# Patient Record
Sex: Female | Born: 1975 | Race: Black or African American | Hispanic: No | Marital: Single | Smoking: Current some day smoker
Health system: Southern US, Community
[De-identification: ages and names within clinical notes are randomized; demographics above are authoritative.]

## PROBLEM LIST (undated history)

## (undated) DIAGNOSIS — J45909 Unspecified asthma, uncomplicated: Secondary | ICD-10-CM

## (undated) DIAGNOSIS — E119 Type 2 diabetes mellitus without complications: Secondary | ICD-10-CM

## (undated) DIAGNOSIS — M1990 Unspecified osteoarthritis, unspecified site: Secondary | ICD-10-CM

## (undated) DIAGNOSIS — I10 Essential (primary) hypertension: Secondary | ICD-10-CM

## (undated) HISTORY — DX: Essential (primary) hypertension: I10

---

## 2000-03-13 HISTORY — PX: CHOLECYSTECTOMY: SHX55

## 2017-03-11 ENCOUNTER — Ambulatory Visit (HOSPITAL_COMMUNITY): Payer: Self-pay | Attending: Emergency Medicine

## 2017-03-11 ENCOUNTER — Encounter (HOSPITAL_COMMUNITY): Payer: Self-pay | Admitting: *Deleted

## 2017-03-11 ENCOUNTER — Emergency Department (HOSPITAL_COMMUNITY)
Admission: EM | Admit: 2017-03-11 | Discharge: 2017-03-11 | Disposition: A | Discharge status: Home / Self Care | Payer: Self-pay | Attending: Emergency Medicine | Admitting: Emergency Medicine

## 2017-03-11 DIAGNOSIS — R52 Pain, unspecified: Secondary | ICD-10-CM | POA: Insufficient documentation

## 2017-03-11 DIAGNOSIS — Z3A09 9 weeks gestation of pregnancy: Secondary | ICD-10-CM

## 2017-03-11 DIAGNOSIS — Z202 Contact with and (suspected) exposure to infections with a predominantly sexual mode of transmission: Secondary | ICD-10-CM | POA: Insufficient documentation

## 2017-03-11 DIAGNOSIS — Z3A08 8 weeks gestation of pregnancy: Secondary | ICD-10-CM | POA: Insufficient documentation

## 2017-03-11 DIAGNOSIS — O26891 Other specified pregnancy related conditions, first trimester: Secondary | ICD-10-CM | POA: Insufficient documentation

## 2017-03-11 DIAGNOSIS — O4691 Antepartum hemorrhage, unspecified, first trimester: Secondary | ICD-10-CM | POA: Insufficient documentation

## 2017-03-11 DIAGNOSIS — N898 Other specified noninflammatory disorders of vagina: Secondary | ICD-10-CM | POA: Insufficient documentation

## 2017-03-11 DIAGNOSIS — O98211 Gonorrhea complicating pregnancy, first trimester: Secondary | ICD-10-CM | POA: Insufficient documentation

## 2017-03-11 LAB — COMPREHENSIVE METABOLIC PANEL
ALK PHOS: 87 U/L (ref 38–126)
ALT: 18 U/L (ref 14–54)
AST: 19 U/L (ref 15–41)
Albumin: 3.7 g/dL (ref 3.5–5.0)
Anion gap: 11 (ref 5–15)
BUN: 10 mg/dL (ref 6–20)
CALCIUM: 9.3 mg/dL (ref 8.9–10.3)
CO2: 19 mmol/L — ABNORMAL LOW (ref 22–32)
CREATININE: 0.73 mg/dL (ref 0.44–1.00)
Chloride: 104 mmol/L (ref 101–111)
GFR calc non Af Amer: >60 mL/min (ref >60)
Glucose, Bld: 95 mg/dL (ref 65–99)
Potassium: 4 mmol/L (ref 3.5–5.1)
Sodium: 134 mmol/L — ABNORMAL LOW (ref 135–145)
Total Bilirubin: 0.2 mg/dL — ABNORMAL LOW (ref 0.3–1.2)
Total Protein: 7.7 g/dL (ref 6.5–8.1)

## 2017-03-11 LAB — URINALYSIS, ROUTINE W REFLEX MICROSCOPIC
GLUCOSE, UA: NEGATIVE mg/dL
Hgb urine dipstick: NEGATIVE
KETONES UR: 5 mg/dL — AB
Nitrite: NEGATIVE
PH: 5 (ref 5.0–8.0)
Protein, ur: 30 mg/dL — AB
SPECIFIC GRAVITY, URINE: 1.033 — AB (ref 1.005–1.030)

## 2017-03-11 LAB — WET PREP, GENITAL
Clue Cells Wet Prep HPF POC: NONE SEEN
SPERM: NONE SEEN
TRICH WET PREP: NONE SEEN
Yeast Wet Prep HPF POC: NONE SEEN

## 2017-03-11 LAB — CBC
HCT: 41.4 % (ref 36.0–46.0)
Hemoglobin: 14.8 g/dL (ref 12.0–15.0)
MCH: 30 pg (ref 26.0–34.0)
MCHC: 35.7 g/dL (ref 30.0–36.0)
MCV: 84 fL (ref 78.0–100.0)
Platelets: 210 10*3/uL (ref 150–400)
RBC: 4.93 MIL/uL (ref 3.87–5.11)
RDW: 13.5 % (ref 11.5–15.5)
WBC: 19.9 10*3/uL — AB (ref 4.0–10.5)

## 2017-03-11 LAB — LIPASE, BLOOD: Lipase: 17 U/L (ref 11–51)

## 2017-03-11 LAB — I-STAT BETA HCG BLOOD, ED (MC, WL, AP ONLY): I-stat hCG, quantitative: >2000 m[IU]/mL — ABNORMAL HIGH (ref <5)

## 2017-03-11 LAB — HCG, QUANTITATIVE, PREGNANCY: HCG, BETA CHAIN, QUANT, S: 162488 m[IU]/mL — AB (ref <5)

## 2017-03-11 MED ORDER — STERILE WATER FOR INJECTION IJ SOLN
INTRAMUSCULAR | Status: AC
  Filled 2017-03-11: qty 10

## 2017-03-11 MED ORDER — CEFTRIAXONE SODIUM 250 MG IJ SOLR
250.0000 mg | Freq: Once | INTRAMUSCULAR | Status: AC
  Administered 2017-03-11: 250 mg via INTRAMUSCULAR
  Filled 2017-03-11: qty 250

## 2017-03-11 MED ORDER — AZITHROMYCIN 250 MG PO TABS
1000.0000 mg | ORAL_TABLET | Freq: Once | ORAL | Status: AC
Start: 2017-03-11 — End: 2017-03-11
  Administered 2017-03-11: 1000 mg via ORAL
  Filled 2017-03-11: qty 4

## 2017-03-11 MED ORDER — SODIUM CHLORIDE 0.9 % IV BOLUS (SEPSIS)
1000.0000 mL | Freq: Once | INTRAVENOUS | Status: AC
  Administered 2017-03-11: 1000 mL via INTRAVENOUS

## 2017-03-11 NOTE — ED Notes (Signed)
Patient c/o burning with urination G11, P6, A4 , states her LMP was Aug. 15th. States her female partner was just treated for chlamydia , states she is having pinkish brown discharge onset 2 days ago. No pre-natal care.

## 2017-03-11 NOTE — ED Notes (Signed)
E signature pad not working. Pt verbalizes understanding of discharge instructions. 

## 2017-03-11 NOTE — ED Notes (Signed)
Patient transported to CT 

## 2017-03-11 NOTE — Discharge Instructions (Signed)
Return here as needed.  Follow-up with the clinic provided.  °

## 2017-03-11 NOTE — ED Triage Notes (Signed)
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

## 2017-03-11 NOTE — ED Notes (Signed)
Pt updated on wait time.  

## 2017-03-12 LAB — GC/CHLAMYDIA PROBE AMP (~~LOC~~) NOT AT ARMC
Chlamydia: NEGATIVE
Neisseria Gonorrhea: POSITIVE — AB

## 2017-03-12 NOTE — ED Provider Notes (Signed)
[facility] EMERGENCY DEPARTMENT Provider Note   CSN: [id number] Arrival date & time: 03/11/17  4098     History   Chief Complaint Chief Complaint  Patient presents with  . Abdominal Pain    HPI Kelsey Hunter is a 41 y.o. female.  HPI Patient presents to the emergency department with vaginal discharge and she feels that she is pregnant.  Patient states that her boyfriend has had discharge from his penis.  She states that her partner was treated for chlamydia.  She states she has had some pinkish discharge which she thought may be blood.  She states that she has had 4 previous miscarriages she does have some lower abdominal discomfort.  The patient denies chest pain, shortness of breath, headache,blurred vision, neck pain, fever, cough, weakness, numbness, dizziness, anorexia, edema,  nausea, vomiting, diarrhea, rash, back pain, dysuria, hematemesis, bloody stool, near syncope, or syncope. History reviewed. No pertinent past medical history.  There are no active problems to display for this patient.   History reviewed. No pertinent surgical history.  OB History    Gravida Para Term Preterm AB Living   1             SAB TAB Ectopic Multiple Live Births                   Home Medications    Prior to Admission medications   Not on File    Family History No family history on file.  Social History Social History  Substance Use Topics  . Smoking status: Never Smoker  . Smokeless tobacco: Never Used  . Alcohol use No     Allergies   Patient has no known allergies.   Review of Systems Review of Systems All other systems negative except as documented in the HPI. All pertinent positives and negatives as reviewed in the HPI.  Physical Exam Updated Vital Signs BP (!) 132/94   Pulse (!) 104   Temp 98.6 F (37 C) (Oral)   Resp 16   Ht 5\' 1"  (1.549 m)   Wt 113.9 kg (251 lb)   LMP 01/10/2017 (Approximate)   SpO2 94%   BMI 47.43 kg/m    Physical Exam  Constitutional: She is oriented to person, place, and time. She appears well-developed and well-nourished. No distress.  HENT:  Head: Normocephalic and atraumatic.  Mouth/Throat: Oropharynx is clear and moist.  Eyes: Pupils are equal, round, and reactive to light.  Neck: Normal range of motion. Neck supple.  Cardiovascular: Normal rate, regular rhythm and normal heart sounds.  Exam reveals no gallop and no friction rub.   No murmur heard. Pulmonary/Chest: Effort normal and breath sounds normal. No respiratory distress. She has no wheezes.  Abdominal: Soft. Bowel sounds are normal. She exhibits no distension. There is no tenderness.  Genitourinary: Pelvic exam was performed with patient supine. Cervix exhibits no motion tenderness, no discharge and no friability. Right adnexum displays no mass, no tenderness and no fullness. Left adnexum displays no mass, no tenderness and no fullness. Vaginal discharge found.  Genitourinary Comments: Chaperone was present  Neurological: She is alert and oriented to person, place, and time. She exhibits normal muscle tone. Coordination normal.  Skin: Skin is warm and dry. Capillary refill takes less than 2 seconds. No rash noted. No erythema.  Psychiatric: She has a normal mood and affect. Her behavior is normal.  Nursing note and vitals reviewed.    ED Treatments / Results  Labs (all labs  ordered are listed, but only abnormal results are displayed) Labs Reviewed  WET PREP, GENITAL - Abnormal; Notable for the following:       Result Value   WBC, Wet Prep HPF POC MODERATE (*)    All other components within normal limits  COMPREHENSIVE METABOLIC PANEL - Abnormal; Notable for the following:    Sodium 134 (*)    CO2 19 (*)    Total Bilirubin 0.2 (*)    All other components within normal limits  CBC - Abnormal; Notable for the following:    WBC 19.9 (*)    All other components within normal limits  URINALYSIS, ROUTINE W REFLEX  MICROSCOPIC - Abnormal; Notable for the following:    Color, Urine AMBER (*)    Specific Gravity, Urine 1.033 (*)    Bilirubin Urine SMALL (*)    Ketones, ur 5 (*)    Protein, ur 30 (*)    Leukocytes, UA TRACE (*)    Bacteria, UA FEW (*)    Squamous Epithelial / LPF 0-5 (*)    All other components within normal limits  HCG, QUANTITATIVE, PREGNANCY - Abnormal; Notable for the following:    hCG, Beta Chain, Quant, S 161,096 (*)    All other components within normal limits  I-STAT BETA HCG BLOOD, ED ([facility], [facility], [facility] ONLY) - Abnormal; Notable for the following:    I-stat hCG, quantitative >2,000.0 (*)    All other components within normal limits  GC/CHLAMYDIA PROBE AMP ([place]) NOT AT [facility] - Abnormal; Notable for the following:    Neisseria gonorrhea **POSITIVE** (*)    All other components within normal limits  LIPASE, BLOOD    EKG  EKG Interpretation None       Radiology US Ob Comp Less 14 Wks  Result Date: 03/11/2017 CLINICAL DATA:  Pelvic pain with vaginal bleeding EXAM: OBSTETRIC <14 WK [place] AND TRANSVAGINAL OB US TECHNIQUE: Both transabdominal and transvaginal ultrasound examinations were performed for complete evaluation of the gestation as well as the maternal uterus, adnexal regions, and pelvic cul-de-sac. Transvaginal technique was performed to assess early pregnancy. COMPARISON:  None. FINDINGS: Intrauterine gestational sac: Visualized Yolk sac:  Visualized Embryo:  Visualized Cardiac Activity: Visualized Heart Rate: 169  bpm CRL:  22  mm   8 w   6 d                  [place] EDC: October 15, 2017 Subchorionic hemorrhage:  None visualized. Maternal uterus/adnexae: Cervical os is closed. There is no appreciable extrauterine pelvic or adnexal mass. No free pelvic fluid. IMPRESSION: Single live anterior gestation with estimated gestational age of approximately 9 weeks. No subchorionic hemorrhage. Cervical os closed. No maternal extrauterine pelvic or adnexal mass. No maternal free pelvic  fluid. Electronically Signed   By: Bretta Bang III M.D.   On: 03/11/2017 14:42   US Ob Transvaginal  Result Date: 03/11/2017 CLINICAL DATA:  Pelvic pain with vaginal bleeding EXAM: OBSTETRIC <14 WK [place] AND TRANSVAGINAL OB US TECHNIQUE: Both transabdominal and transvaginal ultrasound examinations were performed for complete evaluation of the gestation as well as the maternal uterus, adnexal regions, and pelvic cul-de-sac. Transvaginal technique was performed to assess early pregnancy. COMPARISON:  None. FINDINGS: Intrauterine gestational sac: Visualized Yolk sac:  Visualized Embryo:  Visualized Cardiac Activity: Visualized Heart Rate: 169  bpm CRL:  22  mm   8 w   6 d  US EDC: October 15, 2017 Subchorionic hemorrhage:  None visualized. Maternal uterus/adnexae: Cervical os is closed. There is no appreciable extrauterine pelvic or adnexal mass. No free pelvic fluid. IMPRESSION: Single live anterior gestation with estimated gestational age of approximately 9 weeks. No subchorionic hemorrhage. Cervical os closed. No maternal extrauterine pelvic or adnexal mass. No maternal free pelvic fluid. Electronically Signed   By: Bretta BangWilliam  Woodruff III M.D.   On: 03/11/2017 14:42    Procedures Procedures (including critical care time)  Medications Ordered in ED Medications  sodium chloride 0.9 % bolus 1,000 mL (0 mLs Intravenous Stopped 03/11/17 1544)  cefTRIAXone (ROCEPHIN) injection 250 mg (250 mg Intramuscular Given 03/11/17 1528)  azithromycin (ZITHROMAX) tablet 1,000 mg (1,000 mg Oral Given 03/11/17 1528)     Initial Impression / Assessment and Plan / ED Course  I have reviewed the triage vital signs and the nursing notes.  Pertinent labs & imaging results that were available during my care of the patient were reviewed by me and considered in my medical decision making (see chart for details).  Clinical Course as of Mar 13 1603  Tue Mar 11, 2017  1432 HCG, Newman NickelsBeta Chain, Quant, S: (!)  [phone] [MM]    Clinical Course User Index [MM] Avie EchevariaMurphy, Mackenzie, Student-PA   The patient's ultrasound did not show any abnormality at this time the patient is referred to the [facility] I have advised her to return here for any worsening in her condition.  We did treat for STDs based on the fact that her partner was recently treated.   Final Clinical Impressions(s) / ED Diagnoses   Final diagnoses:  [redacted] weeks gestation of pregnancy  STD exposure    New Prescriptions There are no discharge medications for this patient.    Charlestine NightLawyer, Kanen Mottola, PA-C 03/12/17 1607    Charlynne PanderYao, David Hsienta, MD 03/12/17 [phone]

## 2017-04-09 ENCOUNTER — Encounter: Payer: Self-pay | Admitting: Advanced Practice Midwife

## 2017-04-14 ENCOUNTER — Encounter: Payer: Self-pay | Admitting: Family Medicine

## 2017-04-14 ENCOUNTER — Ambulatory Visit (INDEPENDENT_AMBULATORY_CARE_PROVIDER_SITE_OTHER): Payer: Self-pay | Admitting: Family Medicine

## 2017-04-14 VITALS — BP 122/74 | HR 111 | Wt 247.8 lb

## 2017-04-14 DIAGNOSIS — Z1151 Encounter for screening for human papillomavirus (HPV): Secondary | ICD-10-CM

## 2017-04-14 DIAGNOSIS — Z113 Encounter for screening for infections with a predominantly sexual mode of transmission: Secondary | ICD-10-CM

## 2017-04-14 DIAGNOSIS — O0990 Supervision of high risk pregnancy, unspecified, unspecified trimester: Secondary | ICD-10-CM | POA: Insufficient documentation

## 2017-04-14 DIAGNOSIS — O10919 Unspecified pre-existing hypertension complicating pregnancy, unspecified trimester: Secondary | ICD-10-CM

## 2017-04-14 DIAGNOSIS — O10912 Unspecified pre-existing hypertension complicating pregnancy, second trimester: Secondary | ICD-10-CM

## 2017-04-14 DIAGNOSIS — I10 Essential (primary) hypertension: Secondary | ICD-10-CM | POA: Insufficient documentation

## 2017-04-14 DIAGNOSIS — Z124 Encounter for screening for malignant neoplasm of cervix: Secondary | ICD-10-CM

## 2017-04-14 DIAGNOSIS — O0942 Supervision of pregnancy with grand multiparity, second trimester: Secondary | ICD-10-CM | POA: Insufficient documentation

## 2017-04-14 DIAGNOSIS — O09529 Supervision of elderly multigravida, unspecified trimester: Secondary | ICD-10-CM | POA: Insufficient documentation

## 2017-04-14 DIAGNOSIS — N898 Other specified noninflammatory disorders of vagina: Secondary | ICD-10-CM

## 2017-04-14 DIAGNOSIS — Z23 Encounter for immunization: Secondary | ICD-10-CM

## 2017-04-14 DIAGNOSIS — O09522 Supervision of elderly multigravida, second trimester: Secondary | ICD-10-CM

## 2017-04-14 DIAGNOSIS — O0992 Supervision of high risk pregnancy, unspecified, second trimester: Secondary | ICD-10-CM

## 2017-04-14 LAB — POCT URINALYSIS DIP (DEVICE)
BILIRUBIN URINE: NEGATIVE
Glucose, UA: NEGATIVE mg/dL
HGB URINE DIPSTICK: NEGATIVE
Ketones, ur: NEGATIVE mg/dL
LEUKOCYTES UA: NEGATIVE
NITRITE: NEGATIVE
Protein, ur: NEGATIVE mg/dL
SPECIFIC GRAVITY, URINE: 1.02 (ref 1.005–1.030)
Urobilinogen, UA: 0.2 mg/dL (ref 0.0–1.0)
pH: 5.5 (ref 5.0–8.0)

## 2017-04-14 NOTE — Addendum Note (Signed)
Addended by: Faythe CasaBELLAMY, Galileo Colello M on: 04/14/2017 08:30 PM   Modules accepted: Orders

## 2017-04-14 NOTE — Progress Notes (Signed)
Subjective:  Kelsey Hunter is a [id number] 37w5dbeing seen today for her first obstetrical visit.  Her obstetrical history is significant for obesity and chronic hypertension, grand multiparity, AMA. Patient does intend to breast feed. Pregnancy history fully reviewed.  Patient reports nausea.  BP 122/74   Pulse (!) 111   Wt 247 lb 12.8 oz (112.4 kg)   LMP 01/08/2017   BMI 46.82 kg/m   HISTORY: OB History  Gravida Para Term Preterm AB Living  '12 6 6   5 6  '$ SAB TAB Ectopic Multiple Live Births  5       6    # Outcome Date GA Lbr Len/2nd Weight Sex Delivery Anes PTL Lv  12 Current           11 SAB 2017          10 SAB 2016          9 SAB 2014          8 SAB 11/2011          7 SAB 05/2011          6 Term 2011 437w0d7 lb 12 oz (3.515 kg) F Vag-Spont None N LIV  5 Term 2010 4051w0d lb 6 oz (3.345 kg) F Vag-Spont None N LIV  4 Term 2004 40w4w0dlb 7 oz (4.281 kg) F Vag-Spont EPI N LIV  3 Term 2001 40w030w0db 12 oz (3.062 kg) F Vag-Spont None N LIV  2 Term 1994 6w0d93w0d 6 oz (2.892 kg) F Vag-Spont None N LIV  1 Term 1992 [redacted]w[redacted]d 39w0d7 oz (3.374 kg) F Vag-Spont  N LIV      Past Medical History:  Diagnosis Date  . Hypertension     Past Surgical History:  Procedure Laterality Date  . CHOLECYSTECTOMY  03/2000    Family History  Problem Relation Age of Onset  . Kidney disease Father   . Heart disease Father      Exam    Uterus:     Pelvic Exam:    Perineum: No Hemorrhoids, Normal Perineum   Vulva: normal, Bartholin's, Urethra, Skene's normal   Vagina:  normal mucosa   Cervix: multiparous appearance, no bleeding following Pap, no cervical motion tenderness and no lesions   Adnexa: normal adnexa and no mass, fullness, tenderness   Bony Pelvis: gynecoid  System: Breast:  normal appearance, no masses or tenderness, Inspection negative, No nipple retraction or dimpling, No nipple discharge or bleeding, No axillary or supraclavicular adenopathy, Normal to  palpation without dominant masses   Skin: normal coloration and turgor, no rashes    Neurologic: gait normal; reflexes normal and symmetric   Extremities: normal strength, tone, and muscle mass, no deformities, no erythema, induration, or nodules   HEENT PERRLA and extra ocular movement intact   Mouth/Teeth mucous membranes moist, pharynx normal without lesions   Neck supple and no masses   Cardiovascular: regular rate and rhythm, no murmurs or gallops   Respiratory:  appears well, vitals normal, no respiratory distress, acyanotic, normal RR, ear and throat exam is normal, neck free of mass or lymphadenopathy, chest clear, no wheezing, crepitations, rhonchi, normal symmetric air entry   Abdomen: soft, non-tender; bowel sounds normal; no masses,  no organomegaly   Urinary: urethral meatus normal      Assessment:    Pregnancy: [id number] Active Problem List   Diagnosis Date Noted  . Supervision  of high risk pregnancy, antepartum 04/14/2017  . Chronic hypertension during pregnancy, antepartum 04/14/2017  . Advanced maternal age in multigravida 04/14/2017  . Encounter for supervision of pregnancy with grand mulitparity in second trimester, antepartum 04/14/2017  . Morbid obesity ([place]) 04/14/2017      Plan:   1. Supervision of high risk pregnancy, antepartum Genetic Screening discussed: NIPS requested.  Ultrasound discussed; fetal survey: ordered.  Follow up in 4 weeks.  - Obstetric Panel, Including HIV - Culture, OB Urine - Cytology - PAP - Hemoglobinopathy Evaluation - SMN1 Copy Number Analysis - Cystic fibrosis gene test - Vitamin D 1,25 dihydroxy - Flu Vaccine QUAD 36+ mos IM (Fluarix, Quad PF) - [place] MFM OB DETAIL +14 WK; Future  2. Chronic hypertension during pregnancy, antepartum Discussed CHTN in pregnancy, risk of stillbirth, low birthweight, preeclampsia. Start ASA '81mg'$  Discussed Antenatal testing at 32 weeks.  Discussed Serial [place] for weight - Comp Met  (CMET) - Protein / Creatinine Ratio, Urine  3. Elderly multigravida in second trimester Serial [place] for weight NIPS ASA '81mg'$   4. Encounter for supervision of pregnancy with grand mulitparity in second trimester, antepartum  5. Morbid obesity ([place]) Discussed weight gain goals in pregnancy: 10-15# - Hemoglobin A1c     Problem list reviewed and updated. 75% of 30 min visit spent on counseling and coordination of care.     Truett Mainland 04/14/2017

## 2017-04-15 LAB — PROTEIN / CREATININE RATIO, URINE
Creatinine, Urine: 134.8 mg/dL
Protein, Ur: 11.8 mg/dL
Protein/Creat Ratio: 88 mg/g creat (ref 0–200)

## 2017-04-16 LAB — URINE CULTURE, OB REFLEX

## 2017-04-16 LAB — CULTURE, OB URINE

## 2017-04-16 LAB — CYTOLOGY - PAP
Adequacy: ABSENT
Chlamydia: NEGATIVE
DIAGNOSIS: NEGATIVE
HPV (WINDOPATH): NOT DETECTED
NEISSERIA GONORRHEA: NEGATIVE

## 2017-04-16 LAB — CERVICOVAGINAL ANCILLARY ONLY
Bacterial vaginitis: NEGATIVE
CANDIDA VAGINITIS: POSITIVE — AB
Trichomonas: NEGATIVE

## 2017-04-17 ENCOUNTER — Other Ambulatory Visit: Payer: Self-pay | Admitting: Family Medicine

## 2017-04-17 MED ORDER — FLUCONAZOLE 150 MG PO TABS: 150.0000 mg | ORAL_TABLET | Freq: Once | ORAL | 0 refills | Status: AC

## 2017-04-22 ENCOUNTER — Telehealth: Payer: Self-pay | Admitting: General Practice

## 2017-04-22 ENCOUNTER — Telehealth: Payer: Self-pay | Admitting: Obstetrics & Gynecology

## 2017-04-22 LAB — HEPATITIS C ANTIBODY: Hep C Virus Ab: <0.1 s/co ratio (ref 0.0–0.9)

## 2017-04-22 LAB — COMPREHENSIVE METABOLIC PANEL
A/G RATIO: 1.4 (ref 1.2–2.2)
ALT: 18 IU/L (ref 0–32)
AST: 18 IU/L (ref 0–40)
Albumin: 3.7 g/dL (ref 3.5–5.5)
Alkaline Phosphatase: 85 IU/L (ref 39–117)
BUN/Creatinine Ratio: 10 (ref 9–23)
BUN: 7 mg/dL (ref 6–24)
Bilirubin Total: <0.2 mg/dL (ref 0.0–1.2)
CALCIUM: 9.3 mg/dL (ref 8.7–10.2)
CO2: 22 mmol/L (ref 20–29)
CREATININE: 0.68 mg/dL (ref 0.57–1.00)
Chloride: 103 mmol/L (ref 96–106)
GFR, EST AFRICAN AMERICAN: 127 mL/min/{1.73_m2} (ref >59)
GFR, EST NON AFRICAN AMERICAN: 110 mL/min/{1.73_m2} (ref >59)
GLUCOSE: 76 mg/dL (ref 65–99)
Globulin, Total: 2.7 g/dL (ref 1.5–4.5)
POTASSIUM: 3.7 mmol/L (ref 3.5–5.2)
Sodium: 139 mmol/L (ref 134–144)
TOTAL PROTEIN: 6.4 g/dL (ref 6.0–8.5)

## 2017-04-22 LAB — OBSTETRIC PANEL, INCLUDING HIV
ANTIBODY SCREEN: NEGATIVE
BASOS: 0 %
Basophils Absolute: 0 10*3/uL (ref 0.0–0.2)
EOS (ABSOLUTE): 0.1 10*3/uL (ref 0.0–0.4)
EOS: 1 %
HEMATOCRIT: 40.4 % (ref 34.0–46.6)
HEMOGLOBIN: 13.6 g/dL (ref 11.1–15.9)
HIV SCREEN 4TH GENERATION: NONREACTIVE
Hepatitis B Surface Ag: NEGATIVE
IMMATURE GRANS (ABS): 0 10*3/uL (ref 0.0–0.1)
Immature Granulocytes: 0 %
LYMPHS: 19 %
Lymphocytes Absolute: 2.1 10*3/uL (ref 0.7–3.1)
MCH: 28 pg (ref 26.6–33.0)
MCHC: 33.7 g/dL (ref 31.5–35.7)
MCV: 83 fL (ref 79–97)
MONOS ABS: 0.6 10*3/uL (ref 0.1–0.9)
Monocytes: 5 %
NEUTROS ABS: 8.2 10*3/uL — AB (ref 1.4–7.0)
Neutrophils: 75 %
Platelets: 249 10*3/uL (ref 150–379)
RBC: 4.85 x10E6/uL (ref 3.77–5.28)
RDW: 14.6 % (ref 12.3–15.4)
RH TYPE: POSITIVE
RPR Ser Ql: NONREACTIVE
RUBELLA: 2.07 {index} (ref >0.99)
WBC: 11.1 10*3/uL — ABNORMAL HIGH (ref 3.4–10.8)

## 2017-04-22 LAB — HEMOGLOBINOPATHY EVALUATION
Ferritin: 31 ng/mL (ref 15–150)
HGB A: 97.6 % (ref 96.4–98.8)
HGB F QUANT: 0 % (ref 0.0–2.0)
HGB SOLUBILITY: NEGATIVE
Hgb A2 Quant: 2.4 % (ref 1.8–3.2)
Hgb C: 0 %
Hgb S: 0 %
Hgb Variant: 0 %

## 2017-04-22 LAB — VITAMIN D 1,25 DIHYDROXY
Vitamin D 1, 25 (OH)2 Total: 105 pg/mL — ABNORMAL HIGH
Vitamin D2 1, 25 (OH)2: <10 pg/mL
Vitamin D3 1, 25 (OH)2: 101 pg/mL

## 2017-04-22 LAB — SMN1 COPY NUMBER ANALYSIS (SMA CARRIER SCREENING)

## 2017-04-22 LAB — HEMOGLOBIN A1C
Est. average glucose Bld gHb Est-mCnc: 120 mg/dL
Hgb A1c MFr Bld: 5.8 % — ABNORMAL HIGH (ref 4.8–5.6)

## 2017-04-22 LAB — CYSTIC FIBROSIS GENE TEST

## 2017-04-22 NOTE — Telephone Encounter (Signed)
[phone] and [phone]   [facility] please call patient back for results. The best number to reach her at is [phone] or [phone] , and you  may leave a voice message.

## 2017-04-22 NOTE — Telephone Encounter (Signed)
Called patient and informed her of results & medication sent to pharmacy. Patient verbalized understanding & asked about other results. Told patient her blood work isn't back yet but I would check with the lab & we will call her if anything comes back abnormal. Patient verbalized understanding and had no questions

## 2017-04-22 NOTE — Telephone Encounter (Signed)
-----   Message from Levie HeritageJacob J Stinson, DO sent at 04/17/2017  8:10 AM EST ----- Yeast infection on wet prep. Diflucan prescribed - please let pt know

## 2017-04-24 ENCOUNTER — Encounter: Payer: Self-pay | Admitting: *Deleted

## 2017-05-02 ENCOUNTER — Telehealth: Payer: Self-pay | Admitting: General Practice

## 2017-05-02 ENCOUNTER — Encounter: Payer: Self-pay | Admitting: Obstetrics and Gynecology

## 2017-05-02 NOTE — Telephone Encounter (Signed)
Patient called into front office stating she is constipated and hasn't had a bowel movement since Monday or Tuesday. Patient also reports that she is sick with a cold. Recommended she take docusate sodium or colace 100mg  BID as well as miralax for a couple days. Discussed list of approved cough/cold medicines with patient. Patient verbalized understanding to all & had no questions

## 2017-05-13 NOTE — L&D Delivery Note (Signed)
Patient is 42 y.o. [id number] [redacted]w[redacted]d admitted for IOL for A2GDM. S/p IOL with foley bulb, cytotec, followed by Pitocin. AROM at 1836. Prenatal course also complicated by cHTN, AMA, and A2GDM.   GBS positive - PCN given  Delivery Note At 9:17 PM a viable female was delivered via Vaginal, Spontaneous (Presentation: cephalic; ROA with compound hand).  APGAR: 9, 9; weight pending 1hr skin to skin.  Placenta status: Intact.  Cord: 3V with the following complications: None.  Cord pH: N/A  Anesthesia: Epidural  Episiotomy: None Lacerations: None Suture Repair: N/A Est. Blood Loss (mL): 100  Mom to postpartum.  Baby to Couplet care / Skin to Skin.  Caryl Ada, DO 10/08/2017, 9:31 PM OB Fellow Center for [facility]

## 2017-05-20 ENCOUNTER — Encounter (HOSPITAL_COMMUNITY): Payer: Self-pay | Admitting: Family Medicine

## 2017-05-22 ENCOUNTER — Ambulatory Visit (HOSPITAL_COMMUNITY)
Admission: RE | Admit: 2017-05-22 | Discharge: 2017-05-22 | Disposition: A | Discharge status: Home / Self Care | Payer: Medicaid Other | Source: Ambulatory Visit | Attending: Family Medicine | Admitting: Family Medicine

## 2017-05-22 ENCOUNTER — Other Ambulatory Visit (HOSPITAL_COMMUNITY): Payer: Self-pay | Admitting: *Deleted

## 2017-05-22 ENCOUNTER — Encounter (HOSPITAL_COMMUNITY): Payer: Self-pay

## 2017-05-22 ENCOUNTER — Other Ambulatory Visit: Payer: Self-pay | Admitting: Family Medicine

## 2017-05-22 DIAGNOSIS — O322XX0 Maternal care for transverse and oblique lie, not applicable or unspecified: Secondary | ICD-10-CM | POA: Insufficient documentation

## 2017-05-22 DIAGNOSIS — O09522 Supervision of elderly multigravida, second trimester: Secondary | ICD-10-CM

## 2017-05-22 DIAGNOSIS — Z3A19 19 weeks gestation of pregnancy: Secondary | ICD-10-CM

## 2017-05-22 DIAGNOSIS — O10012 Pre-existing essential hypertension complicating pregnancy, second trimester: Secondary | ICD-10-CM | POA: Diagnosis not present

## 2017-05-22 DIAGNOSIS — Z363 Encounter for antenatal screening for malformations: Secondary | ICD-10-CM | POA: Diagnosis present

## 2017-05-22 DIAGNOSIS — O0990 Supervision of high risk pregnancy, unspecified, unspecified trimester: Secondary | ICD-10-CM

## 2017-05-22 DIAGNOSIS — O99212 Obesity complicating pregnancy, second trimester: Secondary | ICD-10-CM | POA: Insufficient documentation

## 2017-05-22 DIAGNOSIS — E669 Obesity, unspecified: Secondary | ICD-10-CM | POA: Diagnosis not present

## 2017-05-26 ENCOUNTER — Encounter: Payer: Medicaid Other | Admitting: Obstetrics and Gynecology

## 2017-05-27 ENCOUNTER — Encounter: Payer: Self-pay | Admitting: Obstetrics and Gynecology

## 2017-05-27 NOTE — Progress Notes (Signed)
Patient did not keep OB appointment for 05/26/2017.  Kelsey Hunter, Jr MD Attending Center for [facility](Faculty Practice)

## 2017-06-06 ENCOUNTER — Other Ambulatory Visit (HOSPITAL_COMMUNITY)
Admission: RE | Admit: 2017-06-06 | Discharge: 2017-06-06 | Disposition: A | Discharge status: Home / Self Care | Payer: Medicaid Other | Source: Ambulatory Visit | Attending: Obstetrics and Gynecology | Admitting: Obstetrics and Gynecology

## 2017-06-06 ENCOUNTER — Ambulatory Visit (INDEPENDENT_AMBULATORY_CARE_PROVIDER_SITE_OTHER): Payer: Medicaid Other | Admitting: Obstetrics and Gynecology

## 2017-06-06 ENCOUNTER — Encounter: Payer: Self-pay | Admitting: Obstetrics and Gynecology

## 2017-06-06 VITALS — BP 115/61 | HR 97 | Wt 245.0 lb

## 2017-06-06 DIAGNOSIS — O09522 Supervision of elderly multigravida, second trimester: Secondary | ICD-10-CM

## 2017-06-06 DIAGNOSIS — O0942 Supervision of pregnancy with grand multiparity, second trimester: Secondary | ICD-10-CM

## 2017-06-06 DIAGNOSIS — O0990 Supervision of high risk pregnancy, unspecified, unspecified trimester: Secondary | ICD-10-CM

## 2017-06-06 DIAGNOSIS — N898 Other specified noninflammatory disorders of vagina: Secondary | ICD-10-CM | POA: Insufficient documentation

## 2017-06-06 DIAGNOSIS — O10919 Unspecified pre-existing hypertension complicating pregnancy, unspecified trimester: Secondary | ICD-10-CM

## 2017-06-06 MED ORDER — PREPLUS 27-1 MG PO TABS: 1.0000 | ORAL_TABLET | Freq: Every day | ORAL | 13 refills | Status: DC

## 2017-06-06 MED ORDER — DOCUSATE SODIUM 100 MG PO CAPS: 100.0000 mg | ORAL_CAPSULE | Freq: Two times a day (BID) | ORAL | 2 refills | Status: DC | PRN

## 2017-06-06 NOTE — Progress Notes (Signed)
Patient reports discharge with fishy smell and vaginal irritation Patient requests Rx for PNV & albuterol inhaler Patient reports having the flu last week- wants doctor's note

## 2017-06-06 NOTE — Addendum Note (Signed)
Addended by: Hermina StaggersERVIN, Jarnell Cordaro L on: 06/06/2017 09:28 AM   Modules accepted: Orders

## 2017-06-06 NOTE — Progress Notes (Signed)
Subjective:  Kelsey Hunter is a 42 y.o. [id number] at 1332w2d being seen today for ongoing prenatal care.  She is currently monitored for the following issues for this high-risk pregnancy and has Supervision of high risk pregnancy, antepartum; Chronic hypertension during pregnancy, antepartum; Advanced maternal age in multigravida; Encounter for supervision of pregnancy with grand mulitparity in second trimester, antepartum; and Morbid obesity (HCC) on their problem list.  Patient reports vaginal irritation and constipation.  Contractions: Not present. Vag. Bleeding: None.  Movement: Present. Denies leaking of fluid.   The following portions of the patient's history were reviewed and updated as appropriate: allergies, current medications, past family history, past medical history, past social history, past surgical history and problem list. Problem list updated.  Objective:   Vitals:   06/06/17 0843  BP: 115/61  Pulse: 97  Weight: 245 lb (111.1 kg)    Fetal Status: Fetal Heart Rate (bpm): 145   Movement: Present     General:  Alert, oriented and cooperative. Patient is in no acute distress.  Skin: Skin is warm and dry. No rash noted.   Cardiovascular: Normal heart rate noted  Respiratory: Normal respiratory effort, no problems with respiration noted  Abdomen: Soft, gravid, appropriate for gestational age. Pain/Pressure: Present     Pelvic:  Cervical exam deferred        Extremities: Normal range of motion.  Edema: Trace  Mental Status: Normal mood and affect. Normal behavior. Normal judgment and thought content.   Urinalysis:      Assessment and Plan:  Pregnancy: [id number] at 4732w2d  1. Chronic hypertension during pregnancy, antepartum BP stable without meds Continue with BASA  2. Supervision of high risk pregnancy, antepartum Colace for constiptation - Prenatal Vit-Fe Fumarate-FA (PREPLUS) 27-1 MG TABS; Take 1 tablet by mouth daily.  Dispense: 30 tablet; Refill: 13 Pt reports  having the flu last week Note to return to work provided  3. Encounter for supervision of pregnancy with grand mulitparity in second trimester, antepartum   4. Elderly multigravida in second trimester Low risk NIPS  5. Vaginal irritation Self swab today - Cervicovaginal ancillary only  Preterm labor symptoms and general obstetric precautions including but not limited to vaginal bleeding, contractions, leaking of fluid and fetal movement were reviewed in detail with the patient. Please refer to After Visit Summary for other counseling recommendations.  Return in about 4 weeks (around 07/04/2017) for OB visit.   Kelsey StaggersErvin, Ruthel Martine L, MD

## 2017-06-06 NOTE — Patient Instructions (Signed)

## 2017-06-09 LAB — CERVICOVAGINAL ANCILLARY ONLY
Bacterial vaginitis: NEGATIVE
Candida vaginitis: NEGATIVE
TRICH (WINDOWPATH): NEGATIVE

## 2017-06-11 ENCOUNTER — Telehealth: Payer: Self-pay | Admitting: General Practice

## 2017-06-11 NOTE — Telephone Encounter (Signed)
Patient called into front office requesting call back for results. Called patient and a man answered stating she wasn't home at the time but left number of [phone]. Called patient and informed her of negative results. Patient asked why she was having a fishy smell then. Told patient I wasn't sure but her test was negative. Suggested she can try rephresh vaginal gel if she would like. Patient verbalized understanding & had no other questions

## 2017-06-20 ENCOUNTER — Ambulatory Visit (HOSPITAL_COMMUNITY): Payer: Medicaid Other

## 2017-07-04 ENCOUNTER — Ambulatory Visit (HOSPITAL_COMMUNITY)
Admission: RE | Admit: 2017-07-04 | Discharge: 2017-07-04 | Disposition: A | Discharge status: Home / Self Care | Payer: Medicaid Other | Source: Ambulatory Visit | Attending: Obstetrics and Gynecology | Admitting: Obstetrics and Gynecology

## 2017-07-04 ENCOUNTER — Other Ambulatory Visit (HOSPITAL_COMMUNITY): Payer: Self-pay | Admitting: *Deleted

## 2017-07-04 DIAGNOSIS — Z3A25 25 weeks gestation of pregnancy: Secondary | ICD-10-CM | POA: Diagnosis not present

## 2017-07-04 DIAGNOSIS — O09522 Supervision of elderly multigravida, second trimester: Secondary | ICD-10-CM | POA: Diagnosis present

## 2017-07-04 DIAGNOSIS — O10012 Pre-existing essential hypertension complicating pregnancy, second trimester: Secondary | ICD-10-CM | POA: Insufficient documentation

## 2017-07-04 DIAGNOSIS — O09529 Supervision of elderly multigravida, unspecified trimester: Secondary | ICD-10-CM

## 2017-07-07 ENCOUNTER — Ambulatory Visit (INDEPENDENT_AMBULATORY_CARE_PROVIDER_SITE_OTHER): Payer: Medicaid Other | Admitting: Obstetrics and Gynecology

## 2017-07-07 ENCOUNTER — Encounter: Payer: Self-pay | Admitting: Obstetrics and Gynecology

## 2017-07-07 VITALS — BP 128/79 | HR 102 | Wt 243.6 lb

## 2017-07-07 DIAGNOSIS — O10919 Unspecified pre-existing hypertension complicating pregnancy, unspecified trimester: Secondary | ICD-10-CM

## 2017-07-07 DIAGNOSIS — O09522 Supervision of elderly multigravida, second trimester: Secondary | ICD-10-CM

## 2017-07-07 DIAGNOSIS — J45909 Unspecified asthma, uncomplicated: Secondary | ICD-10-CM

## 2017-07-07 DIAGNOSIS — O0990 Supervision of high risk pregnancy, unspecified, unspecified trimester: Secondary | ICD-10-CM

## 2017-07-07 MED ORDER — ALBUTEROL SULFATE HFA 108 (90 BASE) MCG/ACT IN AERS: 2.0000 | INHALATION_SPRAY | Freq: Four times a day (QID) | RESPIRATORY_TRACT | 2 refills | Status: DC | PRN

## 2017-07-07 NOTE — Patient Instructions (Signed)

## 2017-07-07 NOTE — Progress Notes (Signed)
Subjective:  Kelsey Hunter is a 42 y.o. [id number] at 4379w5d being seen today for ongoing prenatal care.  She is currently monitored for the following issues for this high-risk pregnancy and has Supervision of high risk pregnancy, antepartum; Chronic hypertension during pregnancy, antepartum; Advanced maternal age in multigravida; Encounter for supervision of pregnancy with grand mulitparity in second trimester, antepartum; and Morbid obesity (HCC) on their problem list.  Patient reports some SOB when she has to make her rounds with work. H/O Asthma in the past. Request refill of MDI.  Contractions: Not present. Vag. Bleeding: None.  Movement: Present. Denies leaking of fluid.   The following portions of the patient's history were reviewed and updated as appropriate: allergies, current medications, past family history, past medical history, past social history, past surgical history and problem list. Problem list updated.  Objective:   Vitals:   07/07/17 1350  BP: 128/79  Pulse: (!) 102  Weight: 243 lb 9.6 oz (110.5 kg)    Fetal Status: Fetal Heart Rate (bpm): 150   Movement: Present     General:  Alert, oriented and cooperative. Patient is in no acute distress.  Skin: Skin is warm and dry. No rash noted.   Cardiovascular: Normal heart rate noted  Respiratory: Normal respiratory effort, no problems with respiration noted  Abdomen: Soft, gravid, appropriate for gestational age. Pain/Pressure: Present     Pelvic:  Cervical exam deferred        Extremities: Normal range of motion.  Edema: Trace  Mental Status: Normal mood and affect. Normal behavior. Normal judgment and thought content.   Urinalysis:      Assessment and Plan:  Pregnancy: [id number] at 8079w5d  1. Supervision of high risk pregnancy, antepartum Stable Letter for work Rx for Albuterol MDI BTL papers signed today Glucola next visit  2. Chronic hypertension during pregnancy, antepartum BP stable without meds Continue with  BASA  3. Elderly multigravida in second trimester Low Risk NIPS  Preterm labor symptoms and general obstetric precautions including but not limited to vaginal bleeding, contractions, leaking of fluid and fetal movement were reviewed in detail with the patient. Please refer to After Visit Summary for other counseling recommendations.  Return in about 2 weeks (around 07/21/2017) for OB visit.   Hermina StaggersErvin, Alexie Samson L, MD

## 2017-07-09 ENCOUNTER — Encounter (HOSPITAL_COMMUNITY): Payer: Self-pay | Admitting: Emergency Medicine

## 2017-07-09 ENCOUNTER — Ambulatory Visit (HOSPITAL_COMMUNITY)
Admission: EM | Admit: 2017-07-09 | Discharge: 2017-07-09 | Disposition: A | Discharge status: Home / Self Care | Payer: Medicaid Other | Attending: Family Medicine | Admitting: Family Medicine

## 2017-07-09 ENCOUNTER — Other Ambulatory Visit: Payer: Self-pay

## 2017-07-09 DIAGNOSIS — K5900 Constipation, unspecified: Secondary | ICD-10-CM | POA: Diagnosis not present

## 2017-07-09 NOTE — Discharge Instructions (Signed)
You may try over the counter MiraLAX. Take as directed on the package. If you are not able to have a bowel movement within the next 48 hours please call your doctor to arrange follow up.

## 2017-07-09 NOTE — ED Triage Notes (Addendum)
Pt states she hasn't had a bowel movement in 4 days, shes tried to eat prunes and take a stool softener. Pt denies abdominal pain. Pt c/o pain in her rectum. Pt is also 6 months pregnant.

## 2017-07-12 NOTE — ED Provider Notes (Signed)
  [facility]   [id number] 07/09/17 Arrival Time: 1641  ASSESSMENT & PLAN:  1. Constipation, unspecified constipation type    Will try OTC Miralax first; OTC enema if she feels this is needed. Will f/u here or with her OB if not improving over the next 24-48 hours.  Reviewed expectations re: course of current medical issues. Questions answered. Outlined signs and symptoms indicating need for more acute intervention. Patient verbalized understanding. After Visit Summary given.   SUBJECTIVE:  Kelsey Hunter is a 42 y.o. female who presents with complaint of constipation. Last bowel movement approximately 4 days ago. Usually has a bowel movement daily. Now with occasional abdominal cramping. No n/v. Prunes and OTC stool softener not helping. Is passing gas regularly. Appetite and PO intake normal. Currently pregnant; 6 months without complications. No urinary symptoms.  OB History    Gravida Para Term Preterm AB Living   12 6 6   5 6    SAB TAB Ectopic Multiple Live Births   5       6     Patient's last menstrual period was 01/08/2017.   Past Surgical History:  Procedure Laterality Date  . CHOLECYSTECTOMY  03/2000    ROS: As per HPI.  OBJECTIVE:  Vitals:   07/09/17 1732  BP: 130/76  Pulse: (!) 113  Resp: 18  Temp: 98.3 F (36.8 C)  SpO2: 100%    General appearance: alert; no distress Lungs: clear to auscultation bilaterally Heart: regular rate and rhythm Abdomen: gravid;soft; non-distended; no tenderness; bowel sounds present Back: no CVA tenderness; FROM at hips Extremities: no edema; symmetrical with no gross deformities Skin: warm and dry Neurologic: normal gait Psychological: alert and cooperative; normal mood and affect  No Known Allergies                                             Past Medical History:  Diagnosis Date  . Hypertension    Social History   Socioeconomic History  . Marital status: Single    Spouse name: Not on file  .  Number of children: Not on file  . Years of education: Not on file  . Highest education level: Not on file  Social Needs  . Financial resource strain: Not on file  . Food insecurity - worry: Not on file  . Food insecurity - inability: Not on file  . Transportation needs - medical: Not on file  . Transportation needs - non-medical: Not on file  Occupational History  . Not on file  Tobacco Use  . Smoking status: Never Smoker  . Smokeless tobacco: Never Used  Substance and Sexual Activity  . Alcohol use: No  . Drug use: No  . Sexual activity: Yes    Birth control/protection: None  Other Topics Concern  . Not on file  Social History Narrative  . Not on file   Family History  Problem Relation Age of Onset  . Kidney disease Father   . Heart disease Father      Mardella LaymanHagler, Lindel Marcell, MD 07/12/17 [phone]

## 2017-07-24 ENCOUNTER — Encounter: Payer: Self-pay | Admitting: Obstetrics and Gynecology

## 2017-07-24 ENCOUNTER — Other Ambulatory Visit: Payer: Medicaid Other

## 2017-07-24 ENCOUNTER — Ambulatory Visit (INDEPENDENT_AMBULATORY_CARE_PROVIDER_SITE_OTHER): Payer: Medicaid Other | Admitting: Obstetrics and Gynecology

## 2017-07-24 VITALS — BP 117/77 | HR 97 | Wt 241.3 lb

## 2017-07-24 DIAGNOSIS — Z23 Encounter for immunization: Secondary | ICD-10-CM | POA: Diagnosis not present

## 2017-07-24 DIAGNOSIS — O09523 Supervision of elderly multigravida, third trimester: Secondary | ICD-10-CM | POA: Diagnosis not present

## 2017-07-24 DIAGNOSIS — O10919 Unspecified pre-existing hypertension complicating pregnancy, unspecified trimester: Secondary | ICD-10-CM

## 2017-07-24 DIAGNOSIS — O0993 Supervision of high risk pregnancy, unspecified, third trimester: Secondary | ICD-10-CM

## 2017-07-24 DIAGNOSIS — O10913 Unspecified pre-existing hypertension complicating pregnancy, third trimester: Secondary | ICD-10-CM

## 2017-07-24 DIAGNOSIS — O0990 Supervision of high risk pregnancy, unspecified, unspecified trimester: Secondary | ICD-10-CM

## 2017-07-24 NOTE — Patient Instructions (Signed)
Third Trimester of Pregnancy The third trimester is from week 28 through week 40 (months 7 through 9). The third trimester is a time when the unborn baby (fetus) is growing rapidly. At the end of the ninth month, the fetus is about 20 inches in length and weighs 6-10 pounds. Body changes during your third trimester Your body will continue to go through many changes during pregnancy. The changes vary from woman to woman. During the third trimester:  Your weight will continue to increase. You can expect to gain 25-35 pounds (11-16 kg) by the end of the pregnancy.  You may begin to get stretch marks on your hips, abdomen, and breasts.  You may urinate more often because the fetus is moving lower into your pelvis and pressing on your bladder.  You may develop or continue to have heartburn. This is caused by increased hormones that slow down muscles in the digestive tract.  You may develop or continue to have constipation because increased hormones slow digestion and cause the muscles that push waste through your intestines to relax.  You may develop hemorrhoids. These are swollen veins (varicose veins) in the rectum that can itch or be painful.  You may develop swollen, bulging veins (varicose veins) in your legs.  You may have increased body aches in the pelvis, back, or thighs. This is due to weight gain and increased hormones that are relaxing your joints.  You may have changes in your hair. These can include thickening of your hair, rapid growth, and changes in texture. Some women also have hair loss during or after pregnancy, or hair that feels dry or thin. Your hair will most likely return to normal after your baby is born.  Your breasts will continue to grow and they will continue to become tender. A yellow fluid (colostrum) may leak from your breasts. This is the first milk you are producing for your baby.  Your belly button may stick out.  You may notice more swelling in your hands,  face, or ankles.  You may have increased tingling or numbness in your hands, arms, and legs. The skin on your belly may also feel numb.  You may feel short of breath because of your expanding uterus.  You may have more problems sleeping. This can be caused by the size of your belly, increased need to urinate, and an increase in your body's metabolism.  You may notice the fetus "dropping," or moving lower in your abdomen (lightening).  You may have increased vaginal discharge.  You may notice your joints feel loose and you may have pain around your pelvic bone.  What to expect at prenatal visits You will have prenatal exams every 2 weeks until week 36. Then you will have weekly prenatal exams. During a routine prenatal visit:  You will be weighed to make sure you and the baby are growing normally.  Your blood pressure will be taken.  Your abdomen will be measured to track your baby's growth.  The fetal heartbeat will be listened to.  Any test results from the previous visit will be discussed.  You may have a cervical check near your due date to see if your cervix has softened or thinned (effaced).  You will be tested for Group B streptococcus. This happens between 35 and 37 weeks.  Your health care provider may ask you:  What your birth plan is.  How you are feeling.  If you are feeling the baby move.  If you have had   any abnormal symptoms, such as leaking fluid, bleeding, severe headaches, or abdominal cramping.  If you are using any tobacco products, including cigarettes, chewing tobacco, and electronic cigarettes.  If you have any questions.  Other tests or screenings that may be performed during your third trimester include:  Blood tests that check for low iron levels (anemia).  Fetal testing to check the health, activity level, and growth of the fetus. Testing is done if you have certain medical conditions or if there are problems during the  pregnancy.  Nonstress test (NST). This test checks the health of your baby to make sure there are no signs of problems, such as the baby not getting enough oxygen. During this test, a belt is placed around your belly. The baby is made to move, and its heart rate is monitored during movement.  What is false labor? False labor is a condition in which you feel small, irregular tightenings of the muscles in the womb (contractions) that usually go away with rest, changing position, or drinking water. These are called Braxton Hicks contractions. Contractions may last for hours, days, or even weeks before true labor sets in. If contractions come at regular intervals, become more frequent, increase in intensity, or become painful, you should see your health care provider. What are the signs of labor?  Abdominal cramps.  Regular contractions that start at 10 minutes apart and become stronger and more frequent with time.  Contractions that start on the top of the uterus and spread down to the lower abdomen and back.  Increased pelvic pressure and dull back pain.  A watery or bloody mucus discharge that comes from the vagina.  Leaking of amniotic fluid. This is also known as your "water breaking." It could be a slow trickle or a gush. Let your health care provider know if it has a color or strange odor. If you have any of these signs, call your health care provider right away, even if it is before your due date. Follow these instructions at home: Medicines  Follow your health care provider's instructions regarding medicine use. Specific medicines may be either safe or unsafe to take during pregnancy.  Take a prenatal vitamin that contains at least 600 micrograms (mcg) of folic acid.  If you develop constipation, try taking a stool softener if your health care provider approves. Eating and drinking  Eat a balanced diet that includes fresh fruits and vegetables, whole grains, good sources of protein  such as meat, eggs, or tofu, and low-fat dairy. Your health care provider will help you determine the amount of weight gain that is right for you.  Avoid raw meat and uncooked cheese. These carry germs that can cause birth defects in the baby.  If you have low calcium intake from food, talk to your health care provider about whether you should take a daily calcium supplement.  Eat four or five small meals rather than three large meals a day.  Limit foods that are high in fat and processed sugars, such as fried and sweet foods.  To prevent constipation: ? Drink enough fluid to keep your urine clear or pale yellow. ? Eat foods that are high in fiber, such as fresh fruits and vegetables, whole grains, and beans. Activity  Exercise only as directed by your health care provider. Most women can continue their usual exercise routine during pregnancy. Try to exercise for 30 minutes at least 5 days a week. Stop exercising if you experience uterine contractions.  Avoid heavy   lifting.  Do not exercise in extreme heat or humidity, or at high altitudes.  Wear low-heel, comfortable shoes.  Practice good posture.  You may continue to have sex unless your health care provider tells you otherwise. Relieving pain and discomfort  Take frequent breaks and rest with your legs elevated if you have leg cramps or low back pain.  Take warm sitz baths to soothe any pain or discomfort caused by hemorrhoids. Use hemorrhoid cream if your health care provider approves.  Wear a good support bra to prevent discomfort from breast tenderness.  If you develop varicose veins: ? Wear support pantyhose or compression stockings as told by your healthcare provider. ? Elevate your feet for 15 minutes, 3-4 times a day. Prenatal care  Write down your questions. Take them to your prenatal visits.  Keep all your prenatal visits as told by your health care provider. This is important. Safety  Wear your seat belt at  all times when driving.  Make a list of emergency phone numbers, including numbers for family, friends, the hospital, and police and fire departments. General instructions  Avoid cat litter boxes and soil used by cats. These carry germs that can cause birth defects in the baby. If you have a cat, ask someone to clean the litter box for you.  Do not travel far distances unless it is absolutely necessary and only with the approval of your health care provider.  Do not use hot tubs, steam rooms, or saunas.  Do not drink alcohol.  Do not use any products that contain nicotine or tobacco, such as cigarettes and e-cigarettes. If you need help quitting, ask your health care provider.  Do not use any medicinal herbs or unprescribed drugs. These chemicals affect the formation and growth of the baby.  Do not douche or use tampons or scented sanitary pads.  Do not cross your legs for long periods of time.  To prepare for the arrival of your baby: ? Take prenatal classes to understand, practice, and ask questions about labor and delivery. ? Make a trial run to the hospital. ? Visit the hospital and tour the maternity area. ? Arrange for maternity or paternity leave through employers. ? Arrange for family and friends to take care of pets while you are in the hospital. ? Purchase a rear-facing car seat and make sure you know how to install it in your car. ? Pack your hospital bag. ? Prepare the baby's nursery. Make sure to remove all pillows and stuffed animals from the baby's crib to prevent suffocation.  Visit your dentist if you have not gone during your pregnancy. Use a soft toothbrush to brush your teeth and be gentle when you floss. Contact a health care provider if:  You are unsure if you are in labor or if your water has broken.  You become dizzy.  You have mild pelvic cramps, pelvic pressure, or nagging pain in your abdominal area.  You have lower back pain.  You have persistent  nausea, vomiting, or diarrhea.  You have an unusual or bad smelling vaginal discharge.  You have pain when you urinate. Get help right away if:  Your water breaks before 37 weeks.  You have regular contractions less than 5 minutes apart before 37 weeks.  You have a fever.  You are leaking fluid from your vagina.  You have spotting or bleeding from your vagina.  You have severe abdominal pain or cramping.  You have rapid weight loss or weight gain.    You have shortness of breath with chest pain.  You notice sudden or extreme swelling of your face, hands, ankles, feet, or legs.  Your baby makes fewer than 10 movements in 2 hours.  You have severe headaches that do not go away when you take medicine.  You have vision changes. Summary  The third trimester is from week 28 through week 40, months 7 through 9. The third trimester is a time when the unborn baby (fetus) is growing rapidly.  During the third trimester, your discomfort may increase as you and your baby continue to gain weight. You may have abdominal, leg, and back pain, sleeping problems, and an increased need to urinate.  During the third trimester your breasts will keep growing and they will continue to become tender. A yellow fluid (colostrum) may leak from your breasts. This is the first milk you are producing for your baby.  False labor is a condition in which you feel small, irregular tightenings of the muscles in the womb (contractions) that eventually go away. These are called Braxton Hicks contractions. Contractions may last for hours, days, or even weeks before true labor sets in.  Signs of labor can include: abdominal cramps; regular contractions that start at 10 minutes apart and become stronger and more frequent with time; watery or bloody mucus discharge that comes from the vagina; increased pelvic pressure and dull back pain; and leaking of amniotic fluid. This information is not intended to replace advice  given to you by your health care provider. Make sure you discuss any questions you have with your health care provider. Document Released: 04/23/2001 Document Revised: 10/05/2015 Document Reviewed: 06/30/2012 Elsevier Interactive Patient Education  2017 [employer]  

## 2017-07-24 NOTE — Addendum Note (Signed)
Addended by: Garret ReddishBARNES, Danaysha Kirn M on: 07/24/2017 12:00 PM   Modules accepted: Orders

## 2017-07-24 NOTE — Progress Notes (Signed)
Subjective:  Kelsey Hunter is a 42 y.o. [id number] at 9960w1d being seen today for ongoing prenatal care.  She is currently monitored for the following issues for this high-risk pregnancy and has Supervision of high risk pregnancy, antepartum; Chronic hypertension during pregnancy, antepartum; Advanced maternal age in multigravida; Encounter for supervision of pregnancy with grand mulitparity in second trimester, antepartum; and Morbid obesity (HCC) on their problem list.  Patient reports no complaints.  Contractions: Not present. Vag. Bleeding: None.  Movement: Present. Denies leaking of fluid.   The following portions of the patient's history were reviewed and updated as appropriate: allergies, current medications, past family history, past medical history, past social history, past surgical history and problem list. Problem list updated.  Objective:   Vitals:   07/24/17 1031  BP: 117/77  Pulse: 97  Weight: 241 lb 4.8 oz (109.5 kg)    Fetal Status: Fetal Heart Rate (bpm): 137   Movement: Present     General:  Alert, oriented and cooperative. Patient is in no acute distress.  Skin: Skin is warm and dry. No rash noted.   Cardiovascular: Normal heart rate noted  Respiratory: Normal respiratory effort, no problems with respiration noted  Abdomen: Soft, gravid, appropriate for gestational age. Pain/Pressure: Present     Pelvic:  Cervical exam deferred        Extremities: Normal range of motion.  Edema: Trace  Mental Status: Normal mood and affect. Normal behavior. Normal judgment and thought content.   Urinalysis:      Assessment and Plan:  Pregnancy: [id number] at 7560w1d  1. Supervision of high risk pregnancy, antepartum 28 week labs and Tdap today  2. Elderly multigravida in third trimester Low risks NIPS  3. Chronic hypertension during pregnancy, antepartum BP stable without meds Continue with BASA F/U growth scan on 08/04/17 Start weekly antenatal testing at 32 weeks  Preterm  labor symptoms and general obstetric precautions including but not limited to vaginal bleeding, contractions, leaking of fluid and fetal movement were reviewed in detail with the patient. Please refer to After Visit Summary for other counseling recommendations.  Return in about 2 weeks (around 08/07/2017) for OB visit.   Hermina StaggersErvin, Michael L, MD

## 2017-07-25 LAB — CBC
HEMATOCRIT: 34.8 % (ref 34.0–46.6)
HEMOGLOBIN: 11.7 g/dL (ref 11.1–15.9)
MCH: 29.8 pg (ref 26.6–33.0)
MCHC: 33.6 g/dL (ref 31.5–35.7)
MCV: 89 fL (ref 79–97)
Platelets: 218 10*3/uL (ref 150–379)
RBC: 3.92 x10E6/uL (ref 3.77–5.28)
RDW: 13.5 % (ref 12.3–15.4)
WBC: 7.6 10*3/uL (ref 3.4–10.8)

## 2017-07-25 LAB — GLUCOSE TOLERANCE, 2 HOURS W/ 1HR
GLUCOSE, 1 HOUR: 168 mg/dL (ref 65–179)
GLUCOSE, FASTING: 108 mg/dL — AB (ref 65–91)
Glucose, 2 hour: 110 mg/dL (ref 65–152)

## 2017-07-25 LAB — RPR: RPR: NONREACTIVE

## 2017-07-25 LAB — HIV ANTIBODY (ROUTINE TESTING W REFLEX): HIV SCREEN 4TH GENERATION: NONREACTIVE

## 2017-07-28 ENCOUNTER — Telehealth: Payer: Self-pay

## 2017-07-28 NOTE — Telephone Encounter (Signed)
Called pt to inform her that she failed her 2 hr GTT. Informed pt that she is scheduled to see the diabetic educator on 08/07/17 at 1100. Pt verbalized understanding.   Please let pt know that she failed her glucola and has GDM  Refer to diabetic educator  Start diet and CBG's qid, fasting and 2 hr PP  Goals fasting < 95 and 2 hr PP < 120  Add Hgb A1C to blood drawn already  Instruct pt to bring CBG's to all her appts  Thanks  Casimiro NeedleMichael

## 2017-07-29 ENCOUNTER — Encounter: Payer: Self-pay | Admitting: *Deleted

## 2017-08-04 ENCOUNTER — Ambulatory Visit (HOSPITAL_COMMUNITY)
Admission: EM | Admit: 2017-08-04 | Discharge: 2017-08-04 | Disposition: A | Discharge status: Home / Self Care | Payer: Medicaid Other | Attending: Emergency Medicine | Admitting: Emergency Medicine

## 2017-08-04 ENCOUNTER — Other Ambulatory Visit: Payer: Self-pay

## 2017-08-04 ENCOUNTER — Ambulatory Visit (HOSPITAL_COMMUNITY): Payer: Medicaid Other

## 2017-08-04 ENCOUNTER — Encounter (HOSPITAL_COMMUNITY): Payer: Self-pay | Admitting: Emergency Medicine

## 2017-08-04 DIAGNOSIS — J069 Acute upper respiratory infection, unspecified: Secondary | ICD-10-CM

## 2017-08-04 MED ORDER — ACETAMINOPHEN 325 MG PO TABS
ORAL_TABLET | ORAL | Status: AC
  Filled 2017-08-04: qty 3

## 2017-08-04 MED ORDER — ACETAMINOPHEN 325 MG PO TABS
975.0000 mg | ORAL_TABLET | Freq: Once | ORAL | Status: AC
  Administered 2017-08-04: 975 mg via ORAL

## 2017-08-04 NOTE — ED Triage Notes (Signed)
Pt is currently pregnant; EDD; 10/15/2017

## 2017-08-04 NOTE — ED Triage Notes (Signed)
C/o HA, sore throat and cough onset last PM

## 2017-08-04 NOTE — ED Provider Notes (Signed)
[facility]    CSN: [id number] Arrival date & time: 08/04/17  1720     History   Chief Complaint Chief Complaint  Patient presents with  . Sore Throat    HPI Kelsey Hunter is a 42 y.o. female.   Kelsey Hunter presents with her daughters with complaints of headache, sinus congestion, sore throat and fatigue which started this am. Daughters have had similar illness. Without cough. Sneezing. No fevers. approximately [redacted] weeks pregnant. Has not taken any medications for symptoms. Good baby movement. No vaginal bleeding no abdominal pain. Hx htn.    ROS per HPI.      Past Medical History:  Diagnosis Date  . Hypertension     Patient Active Problem List   Diagnosis Date Noted  . Supervision of high risk pregnancy, antepartum 04/14/2017  . Chronic hypertension during pregnancy, antepartum 04/14/2017  . Advanced maternal age in multigravida 04/14/2017  . Encounter for supervision of pregnancy with grand mulitparity in second trimester, antepartum 04/14/2017  . Morbid obesity (HCC) 04/14/2017    Past Surgical History:  Procedure Laterality Date  . CHOLECYSTECTOMY  03/2000  . TUBAL LIGATION      OB History    Gravida  12   Para  6   Term  6   Preterm      AB  5   Living  6     SAB  5   TAB      Ectopic      Multiple      Live Births  6            Home Medications    Prior to Admission medications   Medication Sig Start Date End Date Taking? Authorizing Provider  albuterol (PROVENTIL HFA;VENTOLIN HFA) 108 (90 Base) MCG/ACT inhaler Inhale 2 puffs into the lungs every 6 (six) hours as needed for wheezing or shortness of breath. 07/07/17   Hermina StaggersErvin, Michael L, MD  aspirin EC 81 MG tablet Take 81 mg by mouth daily.    [provider]  docusate sodium (COLACE) 100 MG capsule Take 1 capsule (100 mg total) by mouth 2 (two) times daily as needed. 06/06/17   Hermina StaggersErvin, Michael L, MD  Prenatal Vit-Fe Fumarate-FA (PREPLUS) 27-1 MG TABS Take 1 tablet  by mouth daily. 06/06/17   Hermina StaggersErvin, Michael L, MD    Family History Family History  Problem Relation Age of Onset  . Kidney disease Father   . Heart disease Father     Social History Social History   Tobacco Use  . Smoking status: Never Smoker  . Smokeless tobacco: Never Used  Substance Use Topics  . Alcohol use: No  . Drug use: No     Allergies   Patient has no known allergies.   Review of Systems Review of Systems   Physical Exam Triage Vital Signs ED Triage Vitals  Enc Vitals Group     BP 08/04/17 1846 128/77     Pulse Rate 08/04/17 1846 93     Resp --      Temp 08/04/17 1846 98.2 F (36.8 C)     Temp Source 08/04/17 1846 Oral     SpO2 08/04/17 1846 100 %     Weight --      Height --      Head Circumference --      Peak Flow --      Pain Score 08/04/17 1844 10     Pain Loc --  Pain Edu? --      Excl. in GC? --    No data found.  Updated Vital Signs BP 128/77 (BP Location: Left Arm)   Pulse 93   Temp 98.2 F (36.8 C) (Oral)   LMP 01/08/2017   SpO2 100%   Visual Acuity Right Eye Distance:   Left Eye Distance:   Bilateral Distance:    Right Eye Near:   Left Eye Near:    Bilateral Near:     Physical Exam  Constitutional: She is oriented to person, place, and time. She appears well-developed and well-nourished. No distress.  HENT:  Head: Normocephalic and atraumatic.  Right Ear: Tympanic membrane, external ear and ear canal normal.  Left Ear: Tympanic membrane, external ear and ear canal normal.  Nose: Rhinorrhea present. Right sinus exhibits no maxillary sinus tenderness and no frontal sinus tenderness. Left sinus exhibits no maxillary sinus tenderness and no frontal sinus tenderness.  Mouth/Throat: Uvula is midline, oropharynx is clear and moist and mucous membranes are normal. Tonsils are 1+ on the right. Tonsils are 1+ on the left. No tonsillar exudate.  Sneezing noted   Eyes: Pupils are equal, round, and reactive to light. Conjunctivae  and EOM are normal.  Cardiovascular: Normal rate, regular rhythm and normal heart sounds.  Pulmonary/Chest: Effort normal and breath sounds normal.  Lymphadenopathy:    She has no cervical adenopathy.  Neurological: She is alert and oriented to person, place, and time.  Skin: Skin is warm and dry.     UC Treatments / Results  Labs (all labs ordered are listed, but only abnormal results are displayed) Labs Reviewed - No data to display  EKG None Radiology No results found.  Procedures Procedures (including critical care time)  Medications Ordered in UC Medications  acetaminophen (TYLENOL) tablet 975 mg (has no administration in time range)     Initial Impression / Assessment and Plan / UC Course  I have reviewed the triage vital signs and the nursing notes.  Pertinent labs & imaging results that were available during my care of the patient were reviewed by me and considered in my medical decision making (see chart for details).     Benign physical findings. Afebrile. Non toxic in appearance. Viral vs allergic in nature. Information provided for OTC treatments she can use for symptoms during pregnancy. Tylenol provided prior to departure. Return precautions provided. Patient verbalized understanding and agreeable to plan.    Final Clinical Impressions(s) / UC Diagnoses   Final diagnoses:  Upper respiratory tract infection, unspecified type    ED Discharge Orders    None       Controlled Substance Pres[place] Controlled Substance Registry consulted? Not Applicable   Georgetta Haber, NP 08/04/17 1931

## 2017-08-07 ENCOUNTER — Ambulatory Visit: Payer: Medicaid Other | Admitting: *Deleted

## 2017-08-07 ENCOUNTER — Ambulatory Visit (INDEPENDENT_AMBULATORY_CARE_PROVIDER_SITE_OTHER): Payer: Medicaid Other | Admitting: Obstetrics and Gynecology

## 2017-08-07 ENCOUNTER — Encounter: Payer: Medicaid Other | Attending: Obstetrics and Gynecology | Admitting: *Deleted

## 2017-08-07 ENCOUNTER — Ambulatory Visit (HOSPITAL_COMMUNITY)
Admission: RE | Admit: 2017-08-07 | Discharge: 2017-08-07 | Disposition: A | Discharge status: Home / Self Care | Payer: Medicaid Other | Source: Ambulatory Visit | Attending: Obstetrics and Gynecology | Admitting: Obstetrics and Gynecology

## 2017-08-07 VITALS — BP 120/79 | HR 100 | Wt 236.0 lb

## 2017-08-07 DIAGNOSIS — O10913 Unspecified pre-existing hypertension complicating pregnancy, third trimester: Secondary | ICD-10-CM

## 2017-08-07 DIAGNOSIS — O09523 Supervision of elderly multigravida, third trimester: Secondary | ICD-10-CM

## 2017-08-07 DIAGNOSIS — R7309 Other abnormal glucose: Secondary | ICD-10-CM

## 2017-08-07 DIAGNOSIS — O10919 Unspecified pre-existing hypertension complicating pregnancy, unspecified trimester: Secondary | ICD-10-CM

## 2017-08-07 DIAGNOSIS — R7302 Impaired glucose tolerance (oral): Secondary | ICD-10-CM | POA: Diagnosis not present

## 2017-08-07 DIAGNOSIS — O0990 Supervision of high risk pregnancy, unspecified, unspecified trimester: Secondary | ICD-10-CM

## 2017-08-07 DIAGNOSIS — O24419 Gestational diabetes mellitus in pregnancy, unspecified control: Secondary | ICD-10-CM | POA: Insufficient documentation

## 2017-08-07 DIAGNOSIS — O24410 Gestational diabetes mellitus in pregnancy, diet controlled: Secondary | ICD-10-CM

## 2017-08-07 DIAGNOSIS — Z713 Dietary counseling and surveillance: Secondary | ICD-10-CM | POA: Insufficient documentation

## 2017-08-07 DIAGNOSIS — O0993 Supervision of high risk pregnancy, unspecified, third trimester: Secondary | ICD-10-CM

## 2017-08-07 HISTORY — DX: Gestational diabetes mellitus in pregnancy, unspecified control: O24.419

## 2017-08-07 MED ORDER — GLUCOSE BLOOD VI STRP: ORAL_STRIP | 12 refills | Status: DC

## 2017-08-07 MED ORDER — ACCU-CHEK GUIDE W/DEVICE KIT: 1.0000 | PACK | Freq: Once | 0 refills | Status: AC

## 2017-08-07 MED ORDER — ACCU-CHEK FASTCLIX LANCETS MISC: 1.0000 | Freq: Four times a day (QID) | 12 refills | Status: DC

## 2017-08-07 NOTE — Addendum Note (Signed)
Addended by: Kathee DeltonHILLMAN, CARRIE L on: 08/07/2017 02:34 PM   Modules accepted: Orders

## 2017-08-07 NOTE — Addendum Note (Signed)
Addended by: Vevelyn RoyalsPADDOCK, BEVERLY W on: 08/07/2017 04:41 PM   Modules accepted: Orders

## 2017-08-07 NOTE — Progress Notes (Signed)
Subjective:  Ferdinand LangoSandra Lutes is a 42 y.o. [id number] at 580w1d being seen today for ongoing prenatal care.  She is currently monitored for the following issues for this high-risk pregnancy and has Supervision of high risk pregnancy, antepartum; Chronic hypertension during pregnancy, antepartum; Advanced maternal age in multigravida; Encounter for supervision of pregnancy with grand mulitparity in second trimester, antepartum; Morbid obesity (HCC); and Gestational diabetes mellitus (GDM) in third trimester on their problem list.  Patient reports no complaints.  Contractions: Not present. Vag. Bleeding: None.  Movement: Present. Denies leaking of fluid.   The following portions of the patient's history were reviewed and updated as appropriate: allergies, current medications, past family history, past medical history, past social history, past surgical history and problem list. Problem list updated.  Objective:   Vitals:   08/07/17 1155  BP: 120/79  Pulse: 100  Weight: 236 lb (107 kg)    Fetal Status:     Movement: Present     General:  Alert, oriented and cooperative. Patient is in no acute distress.  Skin: Skin is warm and dry. No rash noted.   Cardiovascular: Normal heart rate noted  Respiratory: Normal respiratory effort, no problems with respiration noted  Abdomen: Soft, gravid, appropriate for gestational age. Pain/Pressure: Present     Pelvic:  Cervical exam deferred        Extremities: Normal range of motion.  Edema: Trace  Mental Status: Normal mood and affect. Normal behavior. Normal judgment and thought content.   Urinalysis:      Assessment and Plan:  Pregnancy: [id number] at 4380w1d  1. Diet controlled gestational diabetes mellitus (GDM) in third trimester Saw diabetic educator today DM and pregnancy reviewed with pt   2. Supervision of high risk pregnancy, antepartum Stable  3. Chronic hypertension during pregnancy, antepartum BP stable without meds Continue with  BASA Growth scan today Start weekly antenatal testing with next OB visit  4. Elderly multigravida in third trimester Low risks NIPS  Preterm labor symptoms and general obstetric precautions including but not limited to vaginal bleeding, contractions, leaking of fluid and fetal movement were reviewed in detail with the patient. Please refer to After Visit Summary for other counseling recommendations.  Return in about 2 weeks (around 08/21/2017) for OB visit.   Hermina StaggersErvin, Kris No L, MD

## 2017-08-07 NOTE — Progress Notes (Signed)
  Patient was seen on 08/07/2017 for Gestational Diabetes self-management. EDD 10/15/2017. Patient states no history of GDM. Diet history obtained. She states she works [employer]  From 4 PM to 12 AM Wednesday through Sunday. She states she is avoiding all sodas, sweet beverages, pork and juices. Patient eats good variety of all food groups and beverages including all unsweetened beverages, mostly water.  The following learning objectives were met by the patient :   States the definition of Gestational Diabetes  States why dietary management is important in controlling blood glucose  Describes the effects of carbohydrates on blood glucose levels  Demonstrates ability to create a balanced meal plan  Demonstrates carbohydrate counting   States when to check blood glucose levels  Demonstrates proper blood glucose monitoring techniques  States the effect of stress and exercise on blood glucose levels  States the importance of limiting caffeine and abstaining from alcohol and smoking  Plan:  Aim for 3 Carb Choices per meal (45 grams) +/- 1 either way  Aim for 1-2 Carbs per snack Begin reading food labels for Total Carbohydrate of foods Consider  increasing your activity level by walking or other activity daily as tolerated Begin checking BG before breakfast and 2 hours after first bite of breakfast, lunch and dinner as directed by MD  Bring Log Book/Sheet to every medical appointment   Take medication if directed by MD  Blood glucose monitor Rx called into pharmacy:  Accu Check Guide with Fast Clix drums Patient instructed to test pre breakfast and 2 hours each meal as directed by MD  Patient instructed to monitor glucose levels: FBS: 60 - 95 mg/dl 2 hour: <120 mg/dl  Patient received the following handouts:  Nutrition Diabetes and Pregnancy  Carbohydrate Counting List  Patient will be seen for follow-up as needed.

## 2017-08-08 ENCOUNTER — Ambulatory Visit (HOSPITAL_COMMUNITY): Payer: Medicaid Other | Attending: Maternal & Fetal Medicine

## 2017-08-08 ENCOUNTER — Encounter (HOSPITAL_COMMUNITY): Payer: Self-pay

## 2017-08-13 ENCOUNTER — Ambulatory Visit (HOSPITAL_COMMUNITY)
Admission: EM | Admit: 2017-08-13 | Discharge: 2017-08-13 | Disposition: A | Discharge status: Home / Self Care | Payer: Medicaid Other | Attending: Family Medicine | Admitting: Family Medicine

## 2017-08-13 ENCOUNTER — Encounter (HOSPITAL_COMMUNITY): Payer: Self-pay | Admitting: Emergency Medicine

## 2017-08-13 DIAGNOSIS — H66002 Acute suppurative otitis media without spontaneous rupture of ear drum, left ear: Secondary | ICD-10-CM | POA: Diagnosis not present

## 2017-08-13 DIAGNOSIS — B349 Viral infection, unspecified: Secondary | ICD-10-CM

## 2017-08-13 MED ORDER — CROMOLYN SODIUM 5.2 MG/ACT NA AERS: 1.0000 | INHALATION_SPRAY | Freq: Four times a day (QID) | NASAL | 0 refills | Status: DC

## 2017-08-13 MED ORDER — AMOXICILLIN 875 MG PO TABS: 875.0000 mg | ORAL_TABLET | Freq: Two times a day (BID) | ORAL | 0 refills | Status: AC

## 2017-08-13 NOTE — Discharge Instructions (Signed)
Start amoxicillin for ear infection. You can start cromolyn nasal spray and Claritin to help with nasal congestion/drainage. You can use over the counter nasal saline rinse such as neti pot for nasal congestion. Keep hydrated, your urine should be clear to pale yellow in color. Tylenol for fever and pain. Monitor for any worsening of symptoms, chest pain, shortness of breath, wheezing, swelling of the throat, follow up for reevaluation.

## 2017-08-13 NOTE — ED Triage Notes (Addendum)
PT reports congestion, cough, chills, sore throat, left ear pain. PT is pregnant

## 2017-08-13 NOTE — ED Provider Notes (Signed)
[facility]    CSN: [id number] Arrival date & time: 08/13/17  1048     History   Chief Complaint Chief Complaint  Patient presents with  . URI    HPI Kelsey Hunter is a 42 y.o. female.   42 year old female who is [redacted] weeks pregnant comes in for 4-day history of URI symptoms.  Has had rhinorrhea, nasal congestion, eye burning, chills, left ear pain, sore throat, mild cough.  Denies fever, chills, night sweats.  Denies eye discharge, vision changes, photophobia.  She has not taken anything for her symptoms.  Never smoker.  Denies any abdominal pain, vaginal bleeding.   Normal fetal movement.  Next appointment with OB 08/21/2017.     Past Medical History:  Diagnosis Date  . Hypertension     Patient Active Problem List   Diagnosis Date Noted  . Gestational diabetes mellitus (GDM) in third trimester 08/07/2017  . Supervision of high risk pregnancy, antepartum 04/14/2017  . Chronic hypertension during pregnancy, antepartum 04/14/2017  . Advanced maternal age in multigravida 04/14/2017  . Encounter for supervision of pregnancy with grand mulitparity in second trimester, antepartum 04/14/2017  . Morbid obesity (HCC) 04/14/2017    Past Surgical History:  Procedure Laterality Date  . CHOLECYSTECTOMY  03/2000  . TUBAL LIGATION      OB History    Gravida  12   Para  6   Term  6   Preterm      AB  5   Living  6     SAB  5   TAB      Ectopic      Multiple      Live Births  6            Home Medications    Prior to Admission medications   Medication Sig Start Date End Date Taking? Authorizing Provider  albuterol (PROVENTIL HFA;VENTOLIN HFA) 108 (90 Base) MCG/ACT inhaler Inhale 2 puffs into the lungs every 6 (six) hours as needed for wheezing or shortness of breath. 07/07/17  Yes Hermina Staggers, MD  aspirin EC 81 MG tablet Take 81 mg by mouth daily.   Yes [provider]  docusate sodium (COLACE) 100 MG capsule Take 1 capsule (100 mg  total) by mouth 2 (two) times daily as needed. 06/06/17  Yes Hermina Staggers, MD  Prenatal Vit-Fe Fumarate-FA (PREPLUS) 27-1 MG TABS Take 1 tablet by mouth daily. 06/06/17  Yes Hermina Staggers, MD  ACCU-CHEK FASTCLIX LANCETS MISC 1 Device by Percutaneous route 4 (four) times daily. 08/07/17   Hermina Staggers, MD  amoxicillin (AMOXIL) 875 MG tablet Take 1 tablet (875 mg total) by mouth 2 (two) times daily for 7 days. 08/13/17 08/20/17  Belinda Fisher, PA-C  cromolyn (NASALCROM) 5.2 MG/ACT nasal spray Place 1 spray into both nostrils 4 (four) times daily. 08/13/17   Cathie Hoops, Kaneisha Ellenberger V, PA-C  glucose blood (ACCU-CHEK GUIDE) test strip Use as instructed QID 08/07/17   Hermina Staggers, MD    Family History Family History  Problem Relation Age of Onset  . Kidney disease Father   . Heart disease Father     Social History Social History   Tobacco Use  . Smoking status: Never Smoker  . Smokeless tobacco: Never Used  Substance Use Topics  . Alcohol use: No  . Drug use: No     Allergies   Patient has no known allergies.   Review of Systems Review of Systems  Reason  unable to perform ROS: See HPI as above.     Physical Exam Triage Vital Signs ED Triage Vitals  Enc Vitals Group     BP 08/13/17 1212 105/67     Pulse Rate 08/13/17 1212 99     Resp 08/13/17 1212 (!) 22     Temp 08/13/17 1212 98 F (36.7 C)     Temp src --      SpO2 08/13/17 1212 100 %     Weight 08/13/17 1212 226 lb (102.5 kg)     Height --      Head Circumference --      Peak Flow --      Pain Score 08/13/17 1211 8     Pain Loc --      Pain Edu? --      Excl. in GC? --    No data found.  Updated Vital Signs BP 105/67   Pulse 99   Temp 98 F (36.7 C)   Resp (!) 22   Wt 226 lb (102.5 kg)   LMP 01/08/2017   SpO2 100%   BMI 42.70 kg/m   Physical Exam  Constitutional: She is oriented to person, place, and time. She appears well-developed and well-nourished. No distress.  HENT:  Head: Normocephalic and atraumatic.    Right Ear: Tympanic membrane, external ear and ear canal normal. Tympanic membrane is not erythematous and not bulging.  Left Ear: External ear and ear canal normal. Tympanic membrane is erythematous and bulging.  Nose: Rhinorrhea present. Right sinus exhibits no maxillary sinus tenderness and no frontal sinus tenderness. Left sinus exhibits no maxillary sinus tenderness and no frontal sinus tenderness.  Mouth/Throat: Uvula is midline, oropharynx is clear and moist and mucous membranes are normal.  Eyes: Pupils are equal, round, and reactive to light. Conjunctivae, EOM and lids are normal.  Neck: Normal range of motion. Neck supple.  Cardiovascular: Normal rate, regular rhythm and normal heart sounds. Exam reveals no gallop and no friction rub.  No murmur heard. Pulmonary/Chest: Effort normal and breath sounds normal. She has no decreased breath sounds. She has no wheezes. She has no rhonchi. She has no rales.  Lymphadenopathy:    She has no cervical adenopathy.  Neurological: She is alert and oriented to person, place, and time.  Skin: Skin is warm and dry.  Psychiatric: She has a normal mood and affect. Her behavior is normal. Judgment normal.     UC Treatments / Results  Labs (all labs ordered are listed, but only abnormal results are displayed) Labs Reviewed - No data to display  EKG None Radiology No results found.  Procedures Procedures (including critical care time)  Medications Ordered in UC Medications - No data to display   Initial Impression / Assessment and Plan / UC Course  I have reviewed the triage vital signs and the nursing notes.  Pertinent labs & imaging results that were available during my care of the patient were reviewed by me and considered in my medical decision making (see chart for details).     Amoxicillin for otitis media.  Other symptomatic treatment discussed.  Push fluids.  Return precautions given.  Otherwise follow-up with OB as scheduled  for reevaluation needed.  Patient expresses understanding and agrees to plan.  Final Clinical Impressions(s) / UC Diagnoses   Final diagnoses:  Non-recurrent acute suppurative otitis media of left ear without spontaneous rupture of tympanic membrane  Viral syndrome    ED Discharge Orders  Ordered    amoxicillin (AMOXIL) 875 MG tablet  2 times daily     08/13/17 1235    cromolyn (NASALCROM) 5.2 MG/ACT nasal spray  4 times daily     08/13/17 1235       Belinda FisherYu, Adilson Grafton V, [place] JerseyPA-C 08/13/17 1249

## 2017-08-21 ENCOUNTER — Ambulatory Visit (INDEPENDENT_AMBULATORY_CARE_PROVIDER_SITE_OTHER): Payer: Medicaid Other | Admitting: *Deleted

## 2017-08-21 ENCOUNTER — Other Ambulatory Visit (HOSPITAL_COMMUNITY)
Admission: RE | Admit: 2017-08-21 | Discharge: 2017-08-21 | Disposition: A | Discharge status: Home / Self Care | Payer: Medicaid Other | Source: Ambulatory Visit | Attending: Obstetrics & Gynecology | Admitting: Obstetrics & Gynecology

## 2017-08-21 ENCOUNTER — Encounter: Payer: Self-pay | Admitting: *Deleted

## 2017-08-21 ENCOUNTER — Ambulatory Visit (INDEPENDENT_AMBULATORY_CARE_PROVIDER_SITE_OTHER): Payer: Medicaid Other | Admitting: Obstetrics & Gynecology

## 2017-08-21 ENCOUNTER — Ambulatory Visit: Payer: Self-pay

## 2017-08-21 VITALS — BP 101/81 | HR 106 | Wt 235.7 lb

## 2017-08-21 DIAGNOSIS — O10919 Unspecified pre-existing hypertension complicating pregnancy, unspecified trimester: Secondary | ICD-10-CM

## 2017-08-21 DIAGNOSIS — N898 Other specified noninflammatory disorders of vagina: Secondary | ICD-10-CM

## 2017-08-21 DIAGNOSIS — N760 Acute vaginitis: Secondary | ICD-10-CM | POA: Insufficient documentation

## 2017-08-21 DIAGNOSIS — B9689 Other specified bacterial agents as the cause of diseases classified elsewhere: Secondary | ICD-10-CM | POA: Insufficient documentation

## 2017-08-21 DIAGNOSIS — O09523 Supervision of elderly multigravida, third trimester: Secondary | ICD-10-CM

## 2017-08-21 DIAGNOSIS — B373 Candidiasis of vulva and vagina: Secondary | ICD-10-CM | POA: Insufficient documentation

## 2017-08-21 DIAGNOSIS — O10913 Unspecified pre-existing hypertension complicating pregnancy, third trimester: Secondary | ICD-10-CM

## 2017-08-21 DIAGNOSIS — O24415 Gestational diabetes mellitus in pregnancy, controlled by oral hypoglycemic drugs: Secondary | ICD-10-CM

## 2017-08-21 MED ORDER — PANTOPRAZOLE SODIUM 20 MG PO TBEC: 20.0000 mg | DELAYED_RELEASE_TABLET | Freq: Every day | ORAL | 4 refills | Status: DC

## 2017-08-21 MED ORDER — METFORMIN HCL 500 MG PO TABS: 500.0000 mg | ORAL_TABLET | Freq: Every day | ORAL | 11 refills | Status: DC

## 2017-08-21 MED ORDER — METRONIDAZOLE 500 MG PO TABS: 500.0000 mg | ORAL_TABLET | Freq: Two times a day (BID) | ORAL | 0 refills | Status: AC

## 2017-08-21 MED ORDER — FLUCONAZOLE 150 MG PO TABS
150.0000 mg | ORAL_TABLET | Freq: Once | ORAL | 0 refills | Status: AC
Start: 2017-08-21 — End: 2017-08-21

## 2017-08-21 NOTE — Patient Instructions (Signed)

## 2017-08-21 NOTE — Progress Notes (Addendum)
Pt c/o frequent heartburn which is not relieved consistently w/Tums. Dr. Debroah LoopArnold prescribed Protonix - Rx sent to pharmacy.   Pt informed that the ultrasound is considered a limited OB ultrasound and is not intended to be a complete ultrasound exam.  Patient also informed that the ultrasound is not being completed with the intent of assessing for fetal or placental anomalies or any pelvic abnormalities.  Explained that the purpose of today's ultrasound is to assess for presentation, BPP and amniotic fluid volume.  Patient acknowledges the purpose of the exam and the limitations of the study.

## 2017-08-21 NOTE — Progress Notes (Signed)
   PRENATAL VISIT NOTE  Subjective:  Kelsey Hunter is a 42 y.o. [id number] at 464w1d being seen today for ongoing prenatal care.  She is currently monitored for the following issues for this high-risk pregnancy and has Supervision of high risk pregnancy, antepartum; Chronic hypertension during pregnancy, antepartum; Advanced maternal age in multigravida; Encounter for supervision of pregnancy with grand mulitparity in second trimester, antepartum; Morbid obesity (HCC); and Gestational diabetes mellitus (GDM) in third trimester on their problem list.  Patient reports vaginal irritation and pelvic pressure and discharge with pink stain.  Contractions: Not present. Vag. Bleeding: Scant.  Movement: Present. Denies leaking of fluid.   The following portions of the patient's history were reviewed and updated as appropriate: allergies, current medications, past family history, past medical history, past social history, past surgical history and problem list. Problem list updated.  Objective:   Vitals:   08/21/17 0939  BP: 101/81  Pulse: (!) 106  Weight: 235 lb 11.2 oz (106.9 kg)    Fetal Status: Fetal Heart Rate (bpm): 130 Fundal Height: 33 cm Movement: Present     General:  Alert, oriented and cooperative. Patient is in no acute distress.  Skin: Skin is warm and dry. No rash noted.   Cardiovascular: Normal heart rate noted  Respiratory: Normal respiratory effort, no problems with respiration noted  Abdomen: Soft, gravid, appropriate for gestational age.  Pain/Pressure: Present     Pelvic: Cervical exam performed        Extremities: Normal range of motion.  Edema: Trace  Mental Status: Normal mood and affect. Normal behavior. Normal judgment and thought content.   Assessment and Plan:  Pregnancy: [id number] at 5664w1d  1. Gestational diabetes mellitus (GDM) in third trimester controlled on oral hypoglycemic drug States FBS up to 120s, PP 130s - metroNIDAZOLE (FLAGYL) 500 MG tablet; Take 1  tablet (500 mg total) by mouth 2 (two) times daily for 7 days.  Dispense: 14 tablet; Refill: 0 - fluconazole (DIFLUCAN) 150 MG tablet; Take 1 tablet (150 mg total) by mouth once for 1 dose.  Dispense: 1 tablet; Refill: 0 - metFORMIN (GLUCOPHAGE) 500 MG tablet; Take 1 tablet (500 mg total) by mouth daily with supper.  Dispense: 60 tablet; Refill: 11  Preterm labor symptoms and general obstetric precautions including but not limited to vaginal bleeding, contractions, leaking of fluid and fetal movement were reviewed in detail with the patient. Please refer to After Visit Summary for other counseling recommendations.  Return in about 1 week (around 08/28/2017).  Future Appointments  Date Time Provider Department Center  08/25/2017 11:30 AM [facility]-M[facility] US 1 [facility]-MFCUS MFC-US  08/28/2017  8:15 AM [facility] NST [facility] WOC  08/28/2017  9:15 AM Adam PhenixArnold, Ashaun Gaughan G, [facility]   Was out of work yesterday and today due to discomfort Scheryl DarterJames Cartier Mapel, MD

## 2017-08-22 LAB — CERVICOVAGINAL ANCILLARY ONLY
Bacterial vaginitis: POSITIVE — AB
CANDIDA VAGINITIS: POSITIVE — AB

## 2017-08-25 ENCOUNTER — Encounter: Payer: Self-pay | Admitting: General Practice

## 2017-08-25 ENCOUNTER — Ambulatory Visit (HOSPITAL_COMMUNITY)
Admission: RE | Admit: 2017-08-25 | Discharge: 2017-08-25 | Disposition: A | Discharge status: Home / Self Care | Payer: Medicaid Other | Source: Ambulatory Visit | Attending: Maternal & Fetal Medicine | Admitting: Maternal & Fetal Medicine

## 2017-08-25 ENCOUNTER — Encounter (HOSPITAL_COMMUNITY): Payer: Self-pay

## 2017-08-25 DIAGNOSIS — O10013 Pre-existing essential hypertension complicating pregnancy, third trimester: Secondary | ICD-10-CM | POA: Insufficient documentation

## 2017-08-25 DIAGNOSIS — E669 Obesity, unspecified: Secondary | ICD-10-CM | POA: Insufficient documentation

## 2017-08-25 DIAGNOSIS — O99213 Obesity complicating pregnancy, third trimester: Secondary | ICD-10-CM | POA: Diagnosis not present

## 2017-08-25 DIAGNOSIS — O09523 Supervision of elderly multigravida, third trimester: Secondary | ICD-10-CM | POA: Insufficient documentation

## 2017-08-25 DIAGNOSIS — O09529 Supervision of elderly multigravida, unspecified trimester: Secondary | ICD-10-CM | POA: Diagnosis present

## 2017-08-25 DIAGNOSIS — Z3A32 32 weeks gestation of pregnancy: Secondary | ICD-10-CM | POA: Diagnosis not present

## 2017-08-25 DIAGNOSIS — O10919 Unspecified pre-existing hypertension complicating pregnancy, unspecified trimester: Secondary | ICD-10-CM

## 2017-08-25 DIAGNOSIS — O24415 Gestational diabetes mellitus in pregnancy, controlled by oral hypoglycemic drugs: Secondary | ICD-10-CM | POA: Diagnosis not present

## 2017-08-28 ENCOUNTER — Encounter: Payer: Medicaid Other | Admitting: Obstetrics & Gynecology

## 2017-08-28 ENCOUNTER — Other Ambulatory Visit: Payer: Medicaid Other

## 2017-09-05 ENCOUNTER — Ambulatory Visit (INDEPENDENT_AMBULATORY_CARE_PROVIDER_SITE_OTHER): Payer: Medicaid Other | Admitting: Obstetrics & Gynecology

## 2017-09-05 ENCOUNTER — Ambulatory Visit: Payer: Self-pay

## 2017-09-05 VITALS — BP 125/74 | Wt 233.0 lb

## 2017-09-05 DIAGNOSIS — O24415 Gestational diabetes mellitus in pregnancy, controlled by oral hypoglycemic drugs: Secondary | ICD-10-CM

## 2017-09-05 DIAGNOSIS — O09523 Supervision of elderly multigravida, third trimester: Secondary | ICD-10-CM

## 2017-09-05 DIAGNOSIS — O10919 Unspecified pre-existing hypertension complicating pregnancy, unspecified trimester: Secondary | ICD-10-CM

## 2017-09-05 DIAGNOSIS — O0993 Supervision of high risk pregnancy, unspecified, third trimester: Secondary | ICD-10-CM | POA: Diagnosis not present

## 2017-09-05 DIAGNOSIS — O10913 Unspecified pre-existing hypertension complicating pregnancy, third trimester: Secondary | ICD-10-CM | POA: Diagnosis not present

## 2017-09-05 DIAGNOSIS — O0990 Supervision of high risk pregnancy, unspecified, unspecified trimester: Secondary | ICD-10-CM

## 2017-09-05 NOTE — Progress Notes (Signed)

## 2017-09-05 NOTE — Progress Notes (Signed)
   PRENATAL VISIT NOTE  Subjective:  Kelsey Hunter is a 42 y.o. [id number] at 1957w2d being seen today for ongoing prenatal care.  She is currently monitored for the following issues for this high-risk pregnancy and has Supervision of high risk pregnancy, antepartum; Chronic hypertension during pregnancy, antepartum; Advanced maternal age in multigravida; Encounter for supervision of pregnancy with grand mulitparity in second trimester, antepartum; Morbid obesity (HCC); and Gestational diabetes mellitus (GDM) in third trimester on their problem list.  Patient reports no complaints.  Contractions: Irregular. Vag. Bleeding: None.  Movement: Present. Denies leaking of fluid.   The following portions of the patient's history were reviewed and updated as appropriate: allergies, current medications, past family history, past medical history, past social history, past surgical history and problem list. Problem list updated.  Objective:   Vitals:   09/05/17 1039  BP: 125/74  Weight: 233 lb (105.7 kg)    Fetal Status: Fetal Heart Rate (bpm): NST   Movement: Present  Presentation: Vertex  General:  Alert, oriented and cooperative. Patient is in no acute distress.  Skin: Skin is warm and dry. No rash noted.   Cardiovascular: Normal heart rate noted  Respiratory: Normal respiratory effort, no problems with respiration noted  Abdomen: Soft, gravid, appropriate for gestational age.  Pain/Pressure: Present     Pelvic: Cervical exam deferred        Extremities: Normal range of motion.     Mental Status: Normal mood and affect. Normal behavior. Normal judgment and thought content.   Assessment and Plan:  Pregnancy: [id number] at 3957w2d  1. Gestational diabetes mellitus (GDM) in third trimester controlled on oral hypoglycemic drug - She forgot to bring her sugars with her - US FETAL BPP W/NONSTRESS; Future - Hemoglobin A1c - US MFM OB FOLLOW UP; Future  2. Chronic hypertension during pregnancy,  antepartum  - US FETAL BPP W/NONSTRESS; Future - US MFM OB FOLLOW UP; Future  3. Elderly multigravida in third trimester  - US FETAL BPP W/NONSTRESS; Future - US MFM OB FOLLOW UP; Future  4. Supervision of high risk pregnancy, antepartum  - US MFM OB FOLLOW UP; Future  5. Morbid obesity (HCC)   6. Encounter for supervision of pregnancy with grand mulitparity in second trimester, antepartum   Preterm labor symptoms and general obstetric precautions including but not limited to vaginal bleeding, contractions, leaking of fluid and fetal movement were reviewed in detail with the patient. Please refer to After Visit Summary for other counseling recommendations.  Return in about 1 week (around 09/12/2017) for NST/BPP and HOB weekly.  No future appointments.  Allie BossierMyra C Lyric Hoar, MD

## 2017-09-06 LAB — HEMOGLOBIN A1C
Est. average glucose Bld gHb Est-mCnc: 126 mg/dL
HEMOGLOBIN A1C: 6 % — AB (ref 4.8–5.6)

## 2017-09-08 NOTE — Progress Notes (Signed)
I have reviewed this chart and agree with the RN/CMA assessment and management.    Yerachmiel Spinney C Steffan Caniglia, MD, FACOG Attending Physician, Faculty Practice [facility]  

## 2017-09-09 ENCOUNTER — Telehealth: Payer: Self-pay

## 2017-09-09 ENCOUNTER — Other Ambulatory Visit: Payer: Self-pay | Admitting: Obstetrics & Gynecology

## 2017-09-09 DIAGNOSIS — N760 Acute vaginitis: Secondary | ICD-10-CM

## 2017-09-09 MED ORDER — FLUCONAZOLE 150 MG PO TABS: 150.0000 mg | ORAL_TABLET | Freq: Once | ORAL | 0 refills | Status: DC

## 2017-09-09 MED ORDER — METRONIDAZOLE 500 MG PO TABS: 500.0000 mg | ORAL_TABLET | Freq: Two times a day (BID) | ORAL | 0 refills | Status: DC

## 2017-09-09 NOTE — Telephone Encounter (Signed)
Called Pt to call back regarding labs, When called man answered stating that he was at work & soon as he gets off that he will let her know to call.

## 2017-09-09 NOTE — Telephone Encounter (Signed)
-----   Message from Adam Phenix, MD sent at 09/09/2017  9:03 AM EDT ----- Flagyl and diflucan prescribed

## 2017-09-11 NOTE — Telephone Encounter (Signed)
Patient called into front office stating she is returning our phone call.

## 2017-09-12 ENCOUNTER — Ambulatory Visit: Payer: Medicaid Other | Admitting: General Practice

## 2017-09-12 ENCOUNTER — Ambulatory Visit (INDEPENDENT_AMBULATORY_CARE_PROVIDER_SITE_OTHER): Payer: Medicaid Other | Admitting: Obstetrics & Gynecology

## 2017-09-12 VITALS — BP 121/66 | HR 99 | Wt 233.0 lb

## 2017-09-12 DIAGNOSIS — O09523 Supervision of elderly multigravida, third trimester: Secondary | ICD-10-CM

## 2017-09-12 DIAGNOSIS — O0990 Supervision of high risk pregnancy, unspecified, unspecified trimester: Secondary | ICD-10-CM

## 2017-09-12 DIAGNOSIS — O10919 Unspecified pre-existing hypertension complicating pregnancy, unspecified trimester: Secondary | ICD-10-CM

## 2017-09-12 DIAGNOSIS — O24415 Gestational diabetes mellitus in pregnancy, controlled by oral hypoglycemic drugs: Secondary | ICD-10-CM

## 2017-09-12 NOTE — Progress Notes (Signed)
   PRENATAL VISIT NOTE  Subjective:  Kelsey Hunter is a 42 y.o. [id number] at [redacted]w[redacted]d being seen today for ongoing prenatal care.  She is currently monitored for the following issues for this high-risk pregnancy and has Supervision of high risk pregnancy, antepartum; Chronic hypertension during pregnancy, antepartum; Advanced maternal age in multigravida; Encounter for supervision of pregnancy with grand mulitparity in second trimester, antepartum; Morbid obesity (HCC); and Gestational diabetes mellitus (GDM) in third trimester on their problem list.  Patient reports no complaints.  Contractions: Irregular. Vag. Bleeding: None.  Movement: Present. Denies leaking of fluid.   The following portions of the patient's history were reviewed and updated as appropriate: allergies, current medications, past family history, past medical history, past social history, past surgical history and problem list. Problem list updated.  Objective:   Vitals:   09/12/17 1134  BP: 121/66  Pulse: 99  Weight: 233 lb (105.7 kg)    Fetal Status: Fetal Heart Rate (bpm): 148   Movement: Present     General:  Alert, oriented and cooperative. Patient is in no acute distress.  Skin: Skin is warm and dry. No rash noted.   Cardiovascular: Normal heart rate noted  Respiratory: Normal respiratory effort, no problems with respiration noted  Abdomen: Soft, gravid, appropriate for gestational age.  Pain/Pressure: Present     Pelvic: Cervical exam deferred        Extremities: Normal range of motion.  Edema: Trace  Mental Status: Normal mood and affect. Normal behavior. Normal judgment and thought content.   Assessment and Plan:  Pregnancy: [id number] at [redacted]w[redacted]d  1. Supervision of high risk pregnancy, antepartum   2. Morbid obesity (HCC)   3. Gestational diabetes mellitus (GDM) in third trimester controlled on oral hypoglycemic drug - I have encouraged her to bring her meter/sugars at her next visit Her hba1c was good  last week  4. Chronic hypertension during pregnancy, antepartum   5. Multigravida of advanced maternal age in third trimester   Term labor symptoms and general obstetric precautions including but not limited to vaginal bleeding, contractions, leaking of fluid and fetal movement were reviewed in detail with the patient. Please refer to After Visit Summary for other counseling recommendations.  Return in about 3 days (around 09/15/2017) for BPP and cervical cultures.  Future Appointments  Date Time Provider Department Center  09/19/2017  9:15 AM [facility]-MFC [place] 4 WH-MFCUS MFC-US  09/19/2017 10:15 AM [facility]OC-WOCA NST [facility]A WOC  09/19/2017 11:15 AM Sharyon Cable, CNM WOC-WOCA WOC    Allie Bossier, MD

## 2017-09-12 NOTE — Progress Notes (Signed)
Patient is refusing to be monitored today. States she doesn't have time to wait because she has already been waiting. Patient states baby is fine, you can check on her next week.

## 2017-09-15 ENCOUNTER — Ambulatory Visit (INDEPENDENT_AMBULATORY_CARE_PROVIDER_SITE_OTHER): Payer: Medicaid Other | Admitting: *Deleted

## 2017-09-15 ENCOUNTER — Ambulatory Visit: Payer: Self-pay

## 2017-09-15 VITALS — BP 141/98 | HR 93 | Wt 235.6 lb

## 2017-09-15 DIAGNOSIS — O10913 Unspecified pre-existing hypertension complicating pregnancy, third trimester: Secondary | ICD-10-CM

## 2017-09-15 DIAGNOSIS — O09523 Supervision of elderly multigravida, third trimester: Secondary | ICD-10-CM | POA: Diagnosis not present

## 2017-09-15 DIAGNOSIS — O10919 Unspecified pre-existing hypertension complicating pregnancy, unspecified trimester: Secondary | ICD-10-CM

## 2017-09-15 DIAGNOSIS — O24415 Gestational diabetes mellitus in pregnancy, controlled by oral hypoglycemic drugs: Secondary | ICD-10-CM | POA: Diagnosis present

## 2017-09-15 DIAGNOSIS — Z3A35 35 weeks gestation of pregnancy: Secondary | ICD-10-CM

## 2017-09-15 NOTE — Progress Notes (Signed)
I have reviewed this chart and agree with the RN/CMA assessment and management.    Saeed Toren C Kacia Halley, MD, FACOG Attending Physician, Faculty [facility]  

## 2017-09-15 NOTE — Progress Notes (Signed)
Pt informed that the ultrasound is considered a limited OB ultrasound and is not intended to be a complete ultrasound exam.  Patient also informed that the ultrasound is not being completed with the intent of assessing for fetal or placental anomalies or any pelvic abnormalities.  Explained that the purpose of today's ultrasound is to assess for presentation, BPP and amniotic fluid volume.  Patient acknowledges the purpose of the exam and the limitations of the study.      Pt denies H/A or visual disturbances. Consult w/Dr. Shawnie Pons - orders received for Pre-e labs. Sx of pre-e reviewed w/pt and she was advised to return to hospital if any of those sx occur. Pt voiced understanding.

## 2017-09-16 LAB — COMP. METABOLIC PANEL (12)
ALBUMIN: 3.3 g/dL — AB (ref 3.5–5.5)
ALK PHOS: 187 IU/L — AB (ref 39–117)
AST: 25 IU/L (ref 0–40)
Albumin/Globulin Ratio: 1.1 — ABNORMAL LOW (ref 1.2–2.2)
BUN/Creatinine Ratio: 7 — ABNORMAL LOW (ref 9–23)
BUN: 5 mg/dL — AB (ref 6–24)
Calcium: 9.1 mg/dL (ref 8.7–10.2)
Chloride: 103 mmol/L (ref 96–106)
Creatinine, Ser: 0.68 mg/dL (ref 0.57–1.00)
GFR calc Af Amer: 126 mL/min/{1.73_m2} (ref >59)
GFR calc non Af Amer: 109 mL/min/{1.73_m2} (ref >59)
Globulin, Total: 3 g/dL (ref 1.5–4.5)
Glucose: 84 mg/dL (ref 65–99)
Potassium: 4 mmol/L (ref 3.5–5.2)
SODIUM: 139 mmol/L (ref 134–144)
Total Protein: 6.3 g/dL (ref 6.0–8.5)

## 2017-09-16 LAB — CBC
Hematocrit: 34.8 % (ref 34.0–46.6)
Hemoglobin: 11.8 g/dL (ref 11.1–15.9)
MCH: 28.2 pg (ref 26.6–33.0)
MCHC: 33.9 g/dL (ref 31.5–35.7)
MCV: 83 fL (ref 79–97)
PLATELETS: 178 10*3/uL (ref 150–379)
RBC: 4.19 x10E6/uL (ref 3.77–5.28)
RDW: 13.5 % (ref 12.3–15.4)
WBC: 8.7 10*3/uL (ref 3.4–10.8)

## 2017-09-16 LAB — PROTEIN / CREATININE RATIO, URINE
CREATININE, UR: 193.4 mg/dL
PROTEIN UR: 33.8 mg/dL
PROTEIN/CREAT RATIO: 175 mg/g{creat} (ref 0–200)

## 2017-09-16 NOTE — Progress Notes (Signed)
I have reviewed this chart and agree with the RN/CMA assessment and management.    Mase Dhondt C Lexia Vandevender, MD, FACOG Attending Physician, Faculty Practice [facility]  

## 2017-09-19 ENCOUNTER — Encounter (HOSPITAL_COMMUNITY): Payer: Self-pay

## 2017-09-19 ENCOUNTER — Ambulatory Visit (HOSPITAL_COMMUNITY): Payer: Medicaid Other

## 2017-09-19 ENCOUNTER — Ambulatory Visit (INDEPENDENT_AMBULATORY_CARE_PROVIDER_SITE_OTHER): Payer: Medicaid Other | Admitting: *Deleted

## 2017-09-19 ENCOUNTER — Encounter: Payer: Self-pay | Admitting: Certified Nurse Midwife

## 2017-09-19 ENCOUNTER — Ambulatory Visit (INDEPENDENT_AMBULATORY_CARE_PROVIDER_SITE_OTHER): Payer: Medicaid Other | Admitting: Certified Nurse Midwife

## 2017-09-19 ENCOUNTER — Ambulatory Visit (HOSPITAL_COMMUNITY)
Admission: RE | Admit: 2017-09-19 | Discharge: 2017-09-19 | Disposition: A | Discharge status: Home / Self Care | Payer: Medicaid Other | Source: Ambulatory Visit | Attending: Obstetrics & Gynecology | Admitting: Obstetrics & Gynecology

## 2017-09-19 ENCOUNTER — Other Ambulatory Visit: Payer: Self-pay | Admitting: Obstetrics & Gynecology

## 2017-09-19 ENCOUNTER — Other Ambulatory Visit (HOSPITAL_COMMUNITY)
Admission: RE | Admit: 2017-09-19 | Discharge: 2017-09-19 | Disposition: A | Discharge status: Home / Self Care | Payer: Medicaid Other | Source: Ambulatory Visit | Attending: Certified Nurse Midwife | Admitting: Certified Nurse Midwife

## 2017-09-19 VITALS — BP 126/80 | HR 102 | Wt 237.6 lb

## 2017-09-19 DIAGNOSIS — O24415 Gestational diabetes mellitus in pregnancy, controlled by oral hypoglycemic drugs: Secondary | ICD-10-CM | POA: Insufficient documentation

## 2017-09-19 DIAGNOSIS — O10919 Unspecified pre-existing hypertension complicating pregnancy, unspecified trimester: Secondary | ICD-10-CM

## 2017-09-19 DIAGNOSIS — Z3A36 36 weeks gestation of pregnancy: Secondary | ICD-10-CM

## 2017-09-19 DIAGNOSIS — O09523 Supervision of elderly multigravida, third trimester: Secondary | ICD-10-CM

## 2017-09-19 DIAGNOSIS — O0990 Supervision of high risk pregnancy, unspecified, unspecified trimester: Secondary | ICD-10-CM | POA: Diagnosis not present

## 2017-09-19 DIAGNOSIS — O10913 Unspecified pre-existing hypertension complicating pregnancy, third trimester: Secondary | ICD-10-CM | POA: Insufficient documentation

## 2017-09-19 DIAGNOSIS — Z029 Encounter for administrative examinations, unspecified: Secondary | ICD-10-CM

## 2017-09-19 LAB — POCT URINALYSIS DIP (DEVICE)
Bilirubin Urine: NEGATIVE
Glucose, UA: NEGATIVE mg/dL
Ketones, ur: NEGATIVE mg/dL
Leukocytes, UA: NEGATIVE
Nitrite: NEGATIVE
Protein, ur: 100 mg/dL — AB
Specific Gravity, Urine: >=1.03 (ref 1.005–1.030)
Urobilinogen, UA: 0.2 mg/dL (ref 0.0–1.0)
pH: 5.5 (ref 5.0–8.0)

## 2017-09-19 LAB — OB RESULTS CONSOLE GBS: STREP GROUP B AG: POSITIVE

## 2017-09-19 LAB — OB RESULTS CONSOLE GC/CHLAMYDIA: GC PROBE AMP, GENITAL: NEGATIVE

## 2017-09-19 NOTE — Progress Notes (Signed)
[place] for growth and BPP done today. Pt reports her BP was elevated @ MFM today. Pt states she feels "different".  She denies H/A126/80 or visual disturbances.

## 2017-09-19 NOTE — Progress Notes (Signed)
   PRENATAL VISIT NOTE  Subjective:  Kelsey Hunter is a 42 y.o. [id number] at [redacted]w[redacted]d being seen today for ongoing prenatal care.  She is currently monitored for the following issues for this high-risk pregnancy and has Supervision of high risk pregnancy, antepartum; Chronic hypertension during pregnancy, antepartum; Advanced maternal age in multigravida; Encounter for supervision of pregnancy with grand mulitparity in second trimester, antepartum; Morbid obesity (HCC); and Gestational diabetes mellitus (GDM) in third trimester on their problem list.  Patient reports no complaints.  Contractions: Not present. Vag. Bleeding: None.  Movement: Present. Denies leaking of fluid.   The following portions of the patient's history were reviewed and updated as appropriate: allergies, current medications, past family history, past medical history, past social history, past surgical history and problem list. Problem list updated.  Objective:   Vitals:   09/19/17 1024 09/19/17 1118  BP: 118/73 126/80  Pulse: (!) 102   Weight: 237 lb 9.6 oz (107.8 kg)     Fetal Status: Fetal Heart Rate (bpm): NST Fundal Height: 44 cm Movement: Present  Presentation: Vertex  General:  Alert, oriented and cooperative. Patient is in no acute distress.  Skin: Skin is warm and dry. No rash noted.   Cardiovascular: Normal heart rate noted  Respiratory: Normal respiratory effort, no problems with respiration noted  Abdomen: Soft, gravid, appropriate for gestational age.  Pain/Pressure: Present     Pelvic: Cervical exam performed Dilation: 1 Effacement (%): Thick Station: -3  Extremities: Normal range of motion.  Edema: Mild pitting, slight indentation  Mental Status: Normal mood and affect. Normal behavior. Normal judgment and thought content.   Assessment and Plan:  Pregnancy: [name] at [redacted]w[redacted]d  1. Supervision of high risk pregnancy, antepartum -Patient doing well, no complaints  -Discussed testing for GBS and swabs  obtained  - GC/Chlamydia probe amp (Bud)not at [facility] - Strep Gp B NAA  2. Chronic hypertension during pregnancy, antepartum -Patient not on any medication for BP. BP stable at 126/80 and 118/73 today   3. Gestational diabetes mellitus (GDM) in third trimester controlled on oral hypoglycemic drug -Patient on Metformin for GDM. Logs of blood sugars WNL for the last three weeks   4. Multigravida of advanced maternal age in third trimester   Term labor symptoms and general obstetric precautions including but not limited to vaginal bleeding, contractions, leaking of fluid and fetal movement were reviewed in detail with the patient. Please refer to After Visit Summary for other counseling recommendations.  Return in about 1 week (around 09/26/2017) for NST/BPP and HOB.  Future Appointments  Date Time Provider Department Center  09/25/2017  9:15 AM [facility] NST [facility]  09/25/2017 10:15 AM Allie Bossier, MD [facility]    Sharyon Cable, CNM

## 2017-09-21 LAB — STREP GP B NAA: Strep Gp B NAA: POSITIVE — AB

## 2017-09-22 LAB — GC/CHLAMYDIA PROBE AMP (~~LOC~~) NOT AT ARMC
Chlamydia: NEGATIVE
Neisseria Gonorrhea: NEGATIVE

## 2017-09-25 ENCOUNTER — Ambulatory Visit (INDEPENDENT_AMBULATORY_CARE_PROVIDER_SITE_OTHER): Payer: Medicaid Other | Admitting: *Deleted

## 2017-09-25 ENCOUNTER — Ambulatory Visit: Payer: Self-pay

## 2017-09-25 ENCOUNTER — Ambulatory Visit (INDEPENDENT_AMBULATORY_CARE_PROVIDER_SITE_OTHER): Payer: Medicaid Other | Admitting: Obstetrics & Gynecology

## 2017-09-25 VITALS — BP 125/85 | HR 92 | Wt 235.0 lb

## 2017-09-25 DIAGNOSIS — O10919 Unspecified pre-existing hypertension complicating pregnancy, unspecified trimester: Secondary | ICD-10-CM

## 2017-09-25 DIAGNOSIS — O10913 Unspecified pre-existing hypertension complicating pregnancy, third trimester: Secondary | ICD-10-CM | POA: Diagnosis not present

## 2017-09-25 DIAGNOSIS — O24415 Gestational diabetes mellitus in pregnancy, controlled by oral hypoglycemic drugs: Secondary | ICD-10-CM

## 2017-09-25 DIAGNOSIS — O09523 Supervision of elderly multigravida, third trimester: Secondary | ICD-10-CM

## 2017-09-25 DIAGNOSIS — O0990 Supervision of high risk pregnancy, unspecified, unspecified trimester: Secondary | ICD-10-CM

## 2017-09-25 NOTE — Progress Notes (Signed)
Pt c/o terrible headaches daily, goes away with baby asa

## 2017-09-25 NOTE — Progress Notes (Signed)

## 2017-09-25 NOTE — Progress Notes (Signed)
   PRENATAL VISIT NOTE  Subjective:  Kelsey Hunter is a 42 y.o. [id number] at [redacted]w[redacted]d being seen today for ongoing prenatal care.  She is currently monitored for the following issues for this high-risk pregnancy and has Supervision of high risk pregnancy, antepartum; Chronic hypertension during pregnancy, antepartum; Advanced maternal age in multigravida; Encounter for supervision of pregnancy with grand mulitparity in second trimester, antepartum; Morbid obesity (HCC); and Gestational diabetes mellitus (GDM) in third trimester on their problem list.  Patient reports no complaints.  Contractions: Irritability. Vag. Bleeding: None.  Movement: Present. Denies leaking of fluid.   The following portions of the patient's history were reviewed and updated as appropriate: allergies, current medications, past family history, past medical history, past social history, past surgical history and problem list. Problem list updated.  Objective:   Vitals:   09/25/17 0957  BP: 125/85  Pulse: 92  Weight: 235 lb (106.6 kg)    Fetal Status: Fetal Heart Rate (bpm): NST   Movement: Present     General:  Alert, oriented and cooperative. Patient is in no acute distress.  Skin: Skin is warm and dry. No rash noted.   Cardiovascular: Normal heart rate noted  Respiratory: Normal respiratory effort, no problems with respiration noted  Abdomen: Soft, gravid, appropriate for gestational age.  Pain/Pressure: Present     Pelvic: Cervical exam performed        Extremities: Normal range of motion.  Edema: None  Mental Status: Normal mood and affect. Normal behavior. Normal judgment and thought content.   Assessment and Plan:  Pregnancy: [id number] at [redacted]w[redacted]d  1. Supervision of high risk pregnancy, antepartum   2. Morbid obesity (HCC)   3. Gestational diabetes mellitus (GDM) in third trimester controlled on oral hypoglycemic drug Didn't bring sugars today rec that she take metformin at bedtime as her fbs this  morning was high  4. Chronic hypertension during pregnancy, antepartum - iol at 39 weeks  5. Multigravida of advanced maternal age in third trimester She plans a BTL after delviery  Preterm labor symptoms and general obstetric precautions including but not limited to vaginal bleeding, contractions, leaking of fluid and fetal movement were reviewed in detail with the patient. Please refer to After Visit Summary for other counseling recommendations.  Return for weekly BPP.  No future appointments.  Allie Bossier, MD

## 2017-09-29 ENCOUNTER — Telehealth (HOSPITAL_COMMUNITY): Payer: Self-pay | Admitting: *Deleted

## 2017-09-29 NOTE — Progress Notes (Signed)
I have reviewed this chart and agree with the RN/CMA assessment and management.    Zarra Geffert C Luvia Orzechowski, MD, FACOG Attending Physician, [facility]  

## 2017-09-29 NOTE — Telephone Encounter (Signed)
Preadmission screen  

## 2017-10-03 ENCOUNTER — Ambulatory Visit (INDEPENDENT_AMBULATORY_CARE_PROVIDER_SITE_OTHER): Payer: Medicaid Other | Admitting: *Deleted

## 2017-10-03 ENCOUNTER — Ambulatory Visit (INDEPENDENT_AMBULATORY_CARE_PROVIDER_SITE_OTHER): Payer: Medicaid Other | Admitting: Obstetrics and Gynecology

## 2017-10-03 ENCOUNTER — Encounter: Payer: Self-pay | Admitting: Obstetrics and Gynecology

## 2017-10-03 ENCOUNTER — Ambulatory Visit: Payer: Self-pay

## 2017-10-03 VITALS — BP 135/90 | HR 90 | Wt 234.5 lb

## 2017-10-03 DIAGNOSIS — O0990 Supervision of high risk pregnancy, unspecified, unspecified trimester: Secondary | ICD-10-CM

## 2017-10-03 DIAGNOSIS — O24415 Gestational diabetes mellitus in pregnancy, controlled by oral hypoglycemic drugs: Secondary | ICD-10-CM

## 2017-10-03 DIAGNOSIS — O09523 Supervision of elderly multigravida, third trimester: Secondary | ICD-10-CM

## 2017-10-03 DIAGNOSIS — O10919 Unspecified pre-existing hypertension complicating pregnancy, unspecified trimester: Secondary | ICD-10-CM

## 2017-10-03 LAB — POCT URINALYSIS DIP (DEVICE)
Bilirubin Urine: NEGATIVE
GLUCOSE, UA: NEGATIVE mg/dL
KETONES UR: NEGATIVE mg/dL
Leukocytes, UA: NEGATIVE
NITRITE: NEGATIVE
Protein, ur: NEGATIVE mg/dL
SPECIFIC GRAVITY, URINE: 1.01 (ref 1.005–1.030)
UROBILINOGEN UA: 0.2 mg/dL (ref 0.0–1.0)
pH: 6 (ref 5.0–8.0)

## 2017-10-03 NOTE — Progress Notes (Signed)

## 2017-10-03 NOTE — Progress Notes (Signed)
Subjective:  Kelsey Hunter is a 42 y.o. [id number] at [redacted]w[redacted]d being seen today for ongoing prenatal care.  She is currently monitored for the following issues for this high-risk pregnancy and has Supervision of high risk pregnancy, antepartum; Chronic hypertension during pregnancy, antepartum; Advanced maternal age in multigravida; Encounter for supervision of pregnancy with grand mulitparity in second trimester, antepartum; Morbid obesity (HCC); and Gestational diabetes mellitus (GDM) in third trimester on their problem list.  Patient reports no complaints.  Contractions: Irregular. Vag. Bleeding: None.  Movement: Present. Denies leaking of fluid.   The following portions of the patient's history were reviewed and updated as appropriate: allergies, current medications, past family history, past medical history, past social history, past surgical history and problem list. Problem list updated.  Objective:   Vitals:   10/03/17 1018  BP: 135/90  Pulse: 90  Weight: 234 lb 8 oz (106.4 kg)    Fetal Status: Fetal Heart Rate (bpm): NST   Movement: Present     General:  Alert, oriented and cooperative. Patient is in no acute distress.  Skin: Skin is warm and dry. No rash noted.   Cardiovascular: Normal heart rate noted  Respiratory: Normal respiratory effort, no problems with respiration noted  Abdomen: Soft, gravid, appropriate for gestational age. Pain/Pressure: Present     Pelvic:  Cervical exam deferred        Extremities: Normal range of motion.  Edema: None  Mental Status: Normal mood and affect. Normal behavior. Normal judgment and thought content.   Urinalysis:      Assessment and Plan:  Pregnancy: [id number] at [redacted]w[redacted]d  1. Supervision of high risk pregnancy, antepartum Stable  2. Chronic hypertension during pregnancy, antepartum BP stable without meds Continue with BASA IOL next week BPP 10/10 today  3. Gestational diabetes mellitus (GDM) in third trimester controlled on oral  hypoglycemic drug Pt reports CBG's in goal range BPP/10/10 today For IOL next week  4. Multigravida of advanced maternal age in third trimester Stable  Term labor symptoms and general obstetric precautions including but not limited to vaginal bleeding, contractions, leaking of fluid and fetal movement were reviewed in detail with the patient. Please refer to After Visit Summary for other counseling recommendations.  Return in about 5 weeks (around 11/07/2017) for PP visit.  IOL on 5/29.   Hermina Staggers, MD

## 2017-10-03 NOTE — Patient Instructions (Signed)
Vaginal Delivery Vaginal delivery means that you will give birth by pushing your baby out of your birth canal (vagina). A team of health care providers will help you before, during, and after vaginal delivery. Birth experiences are unique for every woman and every pregnancy, and birth experiences vary depending on where you choose to give birth. What should I do to prepare for my baby's birth? Before your baby is born, it is important to talk with your health care provider about:  Your labor and delivery preferences. These may include: ? Medicines that you may be given. ? How you will manage your pain. This might include non-medical pain relief techniques or injectable pain relief such as epidural analgesia. ? How you and your baby will be monitored during labor and delivery. ? Who may be in the labor and delivery room with you. ? Your feelings about surgical delivery of your baby (cesarean delivery, or C-section) if this becomes necessary. ? Your feelings about receiving donated blood through an IV tube (blood transfusion) if this becomes necessary.  Whether you are able: ? To take pictures or videos of the birth. ? To eat during labor and delivery. ? To move around, walk, or change positions during labor and delivery.  What to expect after your baby is born, such as: ? Whether delayed umbilical cord clamping and cutting is offered. ? Who will care for your baby right after birth. ? Medicines or tests that may be recommended for your baby. ? Whether breastfeeding is supported in your hospital or birth center. ? How long you will be in the hospital or birth center.  How any medical conditions you have may affect your baby or your labor and delivery experience.  To prepare for your baby's birth, you should also:  Attend all of your health care visits before delivery (prenatal visits) as recommended by your health care provider. This is important.  Prepare your home for your baby's  arrival. Make sure that you have: ? Diapers. ? Baby clothing. ? Feeding equipment. ? Safe sleeping arrangements for you and your baby.  Install a car seat in your vehicle. Have your car seat checked by a certified car seat installer to make sure that it is installed safely.  Think about who will help you with your new baby at home for at least the first several weeks after delivery.  What can I expect when I arrive at the birth center or hospital? Once you are in labor and have been admitted into the hospital or birth center, your health care provider may:  Review your pregnancy history and any concerns you have.  Insert an IV tube into one of your veins. This is used to give you fluids and medicines.  Check your blood pressure, pulse, temperature, and heart rate (vital signs).  Check whether your bag of water (amniotic sac) has broken (ruptured).  Talk with you about your birth plan and discuss pain control options.  Monitoring Your health care provider may monitor your contractions (uterine monitoring) and your baby's heart rate (fetal monitoring). You may need to be monitored:  Often, but not continuously (intermittently).  All the time or for long periods at a time (continuously). Continuous monitoring may be needed if: ? You are taking certain medicines, such as medicine to relieve pain or make your contractions stronger. ? You have pregnancy or labor complications.  Monitoring may be done by:  Placing a special stethoscope or a handheld monitoring device on your abdomen to   check your baby's heartbeat, and feeling your abdomen for contractions. This method of monitoring does not continuously record your baby's heartbeat or your contractions.  Placing monitors on your abdomen (external monitors) to record your baby's heartbeat and the frequency and length of contractions. You may not have to wear external monitors all the time.  Placing monitors inside of your uterus  (internal monitors) to record your baby's heartbeat and the frequency, length, and strength of your contractions. ? Your health care provider may use internal monitors if he or she needs more information about the strength of your contractions or your baby's heart rate. ? Internal monitors are put in place by passing a thin, flexible wire through your vagina and into your uterus. Depending on the type of monitor, it may remain in your uterus or on your baby's head until birth. ? Your health care provider will discuss the benefits and risks of internal monitoring with you and will ask for your permission before inserting the monitors.  Telemetry. This is a type of continuous monitoring that can be done with external or internal monitors. Instead of having to stay in bed, you are able to move around during telemetry. Ask your health care provider if telemetry is an option for you.  Physical exam Your health care provider may perform a physical exam. This may include:  Checking whether your baby is positioned: ? With the head toward your vagina (head-down). This is most common. ? With the head toward the top of your uterus (head-up or breech). If your baby is in a breech position, your health care provider may try to turn your baby to a head-down position so you can deliver vaginally. If it does not seem that your baby can be born vaginally, your provider may recommend surgery to deliver your baby. In rare cases, you may be able to deliver vaginally if your baby is head-up (breech delivery). ? Lying sideways (transverse). Babies that are lying sideways cannot be delivered vaginally.  Checking your cervix to determine: ? Whether it is thinning out (effacing). ? Whether it is opening up (dilating). ? How low your baby has moved into your birth canal.  What are the three stages of labor and delivery?  Normal labor and delivery is divided into the following three stages: Stage 1  Stage 1 is the  longest stage of labor, and it can last for hours or days. Stage 1 includes: ? Early labor. This is when contractions may be irregular, or regular and mild. Generally, early labor contractions are more than 10 minutes apart. ? Active labor. This is when contractions get longer, more regular, more frequent, and more intense. ? The transition phase. This is when contractions happen very close together, are very intense, and may last longer than during any other part of labor.  Contractions generally feel mild, infrequent, and irregular at first. They get stronger, more frequent (about every 2-3 minutes), and more regular as you progress from early labor through active labor and transition.  Many women progress through stage 1 naturally, but you may need help to continue making progress. If this happens, your health care provider may talk with you about: ? Rupturing your amniotic sac if it has not ruptured yet. ? Giving you medicine to help make your contractions stronger and more frequent.  Stage 1 ends when your cervix is completely dilated to 4 inches (10 cm) and completely effaced. This happens at the end of the transition phase. Stage 2  Once   your cervix is completely effaced and dilated to 4 inches (10 cm), you may start to feel an urge to push. It is common for the body to naturally take a rest before feeling the urge to push, especially if you received an epidural or certain other pain medicines. This rest period may last for up to 1-2 hours, depending on your unique labor experience.  During stage 2, contractions are generally less painful, because pushing helps relieve contraction pain. Instead of contraction pain, you may feel stretching and burning pain, especially when the widest part of your baby's head passes through the vaginal opening (crowning).  Your health care provider will closely monitor your pushing progress and your baby's progress through the vagina during stage 2.  Your  health care provider may massage the area of skin between your vaginal opening and anus (perineum) or apply warm compresses to your perineum. This helps it stretch as the baby's head starts to crown, which can help prevent perineal tearing. ? In some cases, an incision may be made in your perineum (episiotomy) to allow the baby to pass through the vaginal opening. An episiotomy helps to make the opening of the vagina larger to allow more room for the baby to fit through.  It is very important to breathe and focus so your health care provider can control the delivery of your baby's head. Your health care provider may have you decrease the intensity of your pushing, to help prevent perineal tearing.  After delivery of your baby's head, the shoulders and the rest of the body generally deliver very quickly and without difficulty.  Once your baby is delivered, the umbilical cord may be cut right away, or this may be delayed for 1-2 minutes, depending on your baby's health. This may vary among health care providers, hospitals, and birth centers.  If you and your baby are healthy enough, your baby may be placed on your chest or abdomen to help maintain the baby's temperature and to help you bond with each other. Some mothers and babies start breastfeeding at this time. Your health care team will dry your baby and help keep your baby warm during this time.  Your baby may need immediate care if he or she: ? Showed signs of distress during labor. ? Has a medical condition. ? Was born too early (prematurely). ? Had a bowel movement before birth (meconium). ? Shows signs of difficulty transitioning from being inside the uterus to being outside of the uterus. If you are planning to breastfeed, your health care team will help you begin a feeding. Stage 3  The third stage of labor starts immediately after the birth of your baby and ends after you deliver the placenta. The placenta is an organ that develops  during pregnancy to provide oxygen and nutrients to your baby in the womb.  Delivering the placenta may require some pushing, and you may have mild contractions. Breastfeeding can stimulate contractions to help you deliver the placenta.  After the placenta is delivered, your uterus should tighten (contract) and become firm. This helps to stop bleeding in your uterus. To help your uterus contract and to control bleeding, your health care provider may: ? Give you medicine by injection, through an IV tube, by mouth, or through your rectum (rectally). ? Massage your abdomen or perform a vaginal exam to remove any blood clots that are left in your uterus. ? Empty your bladder by placing a thin, flexible tube (catheter) into your bladder. ? Encourage   you to breastfeed your baby. After labor is over, you and your baby will be monitored closely to ensure that you are both healthy until you are ready to go home. Your health care team will teach you how to care for yourself and your baby. This information is not intended to replace advice given to you by your health care provider. Make sure you discuss any questions you have with your health care provider. Document Released: 02/06/2008 Document Revised: 11/17/2015 Document Reviewed: 05/14/2015 Elsevier Interactive Patient Education  2018 [employer]  

## 2017-10-03 NOTE — Progress Notes (Signed)
Pt reports occasional H/A - relieved by Tylenol. Pt denies visual disturbances. IOL scheduled on 5/29

## 2017-10-07 ENCOUNTER — Encounter: Payer: Self-pay | Admitting: *Deleted

## 2017-10-08 ENCOUNTER — Encounter (HOSPITAL_COMMUNITY): Payer: Self-pay

## 2017-10-08 ENCOUNTER — Encounter: Payer: Medicaid Other | Admitting: Obstetrics & Gynecology

## 2017-10-08 ENCOUNTER — Other Ambulatory Visit: Payer: Medicaid Other

## 2017-10-08 ENCOUNTER — Inpatient Hospital Stay (HOSPITAL_COMMUNITY)
Admission: RE | Admit: 2017-10-08 | Discharge: 2017-10-10 | DRG: 797 | Disposition: A | Discharge status: Home / Self Care | Payer: Medicaid Other | Attending: Obstetrics & Gynecology | Admitting: Obstetrics & Gynecology

## 2017-10-08 ENCOUNTER — Inpatient Hospital Stay (HOSPITAL_COMMUNITY): Payer: Medicaid Other | Admitting: Anesthesiology

## 2017-10-08 DIAGNOSIS — O1002 Pre-existing essential hypertension complicating childbirth: Secondary | ICD-10-CM | POA: Diagnosis present

## 2017-10-08 DIAGNOSIS — Z302 Encounter for sterilization: Secondary | ICD-10-CM

## 2017-10-08 DIAGNOSIS — O9952 Diseases of the respiratory system complicating childbirth: Secondary | ICD-10-CM | POA: Diagnosis present

## 2017-10-08 DIAGNOSIS — O99214 Obesity complicating childbirth: Secondary | ICD-10-CM | POA: Diagnosis present

## 2017-10-08 DIAGNOSIS — O09529 Supervision of elderly multigravida, unspecified trimester: Secondary | ICD-10-CM

## 2017-10-08 DIAGNOSIS — O24425 Gestational diabetes mellitus in childbirth, controlled by oral hypoglycemic drugs: Principal | ICD-10-CM | POA: Diagnosis present

## 2017-10-08 DIAGNOSIS — Z3A39 39 weeks gestation of pregnancy: Secondary | ICD-10-CM

## 2017-10-08 DIAGNOSIS — J45909 Unspecified asthma, uncomplicated: Secondary | ICD-10-CM | POA: Diagnosis present

## 2017-10-08 DIAGNOSIS — O134 Gestational [pregnancy-induced] hypertension without significant proteinuria, complicating childbirth: Secondary | ICD-10-CM | POA: Diagnosis present

## 2017-10-08 DIAGNOSIS — O99824 Streptococcus B carrier state complicating childbirth: Secondary | ICD-10-CM | POA: Diagnosis present

## 2017-10-08 DIAGNOSIS — O24419 Gestational diabetes mellitus in pregnancy, unspecified control: Secondary | ICD-10-CM

## 2017-10-08 DIAGNOSIS — O0990 Supervision of high risk pregnancy, unspecified, unspecified trimester: Secondary | ICD-10-CM

## 2017-10-08 DIAGNOSIS — O09523 Supervision of elderly multigravida, third trimester: Secondary | ICD-10-CM

## 2017-10-08 DIAGNOSIS — O10919 Unspecified pre-existing hypertension complicating pregnancy, unspecified trimester: Secondary | ICD-10-CM | POA: Diagnosis present

## 2017-10-08 DIAGNOSIS — I10 Essential (primary) hypertension: Secondary | ICD-10-CM | POA: Diagnosis present

## 2017-10-08 HISTORY — DX: Unspecified asthma, uncomplicated: J45.909

## 2017-10-08 LAB — COMPREHENSIVE METABOLIC PANEL
ALBUMIN: 2.6 g/dL — AB (ref 3.5–5.0)
ALK PHOS: 220 U/L — AB (ref 38–126)
ALT: 24 U/L (ref 14–54)
AST: 29 U/L (ref 15–41)
Anion gap: 10 (ref 5–15)
BILIRUBIN TOTAL: 0.5 mg/dL (ref 0.3–1.2)
BUN: 8 mg/dL (ref 6–20)
CALCIUM: 9 mg/dL (ref 8.9–10.3)
CO2: 18 mmol/L — ABNORMAL LOW (ref 22–32)
CREATININE: 0.62 mg/dL (ref 0.44–1.00)
Chloride: 107 mmol/L (ref 101–111)
GFR calc Af Amer: >60 mL/min (ref >60)
GLUCOSE: 109 mg/dL — AB (ref 65–99)
POTASSIUM: 4.1 mmol/L (ref 3.5–5.1)
Sodium: 135 mmol/L (ref 135–145)
TOTAL PROTEIN: 6.4 g/dL — AB (ref 6.5–8.1)

## 2017-10-08 LAB — CBC
HCT: 35.3 % — ABNORMAL LOW (ref 36.0–46.0)
HEMATOCRIT: 36.5 % (ref 36.0–46.0)
HEMOGLOBIN: 11.7 g/dL — AB (ref 12.0–15.0)
Hemoglobin: 12 g/dL (ref 12.0–15.0)
MCH: 28.5 pg (ref 26.0–34.0)
MCH: 28.3 pg (ref 26.0–34.0)
MCHC: 33.1 g/dL (ref 30.0–36.0)
MCHC: 32.9 g/dL (ref 30.0–36.0)
MCV: 86.1 fL (ref 78.0–100.0)
MCV: 86.1 fL (ref 78.0–100.0)
PLATELETS: 160 10*3/uL (ref 150–400)
Platelets: 161 10*3/uL (ref 150–400)
RBC: 4.1 MIL/uL (ref 3.87–5.11)
RBC: 4.24 MIL/uL (ref 3.87–5.11)
RDW: 13.5 % (ref 11.5–15.5)
RDW: 13.7 % (ref 11.5–15.5)
WBC: 8.4 10*3/uL (ref 4.0–10.5)
WBC: 8.3 10*3/uL (ref 4.0–10.5)

## 2017-10-08 LAB — TYPE AND SCREEN
ABO/RH(D): O POS
Antibody Screen: NEGATIVE

## 2017-10-08 LAB — GLUCOSE, CAPILLARY
GLUCOSE-CAPILLARY: 117 mg/dL — AB (ref 65–99)
GLUCOSE-CAPILLARY: 92 mg/dL (ref 65–99)
Glucose-Capillary: 80 mg/dL (ref 65–99)
Glucose-Capillary: 101 mg/dL — ABNORMAL HIGH (ref 65–99)

## 2017-10-08 LAB — RPR: RPR Ser Ql: NONREACTIVE

## 2017-10-08 LAB — PROTEIN / CREATININE RATIO, URINE
CREATININE, URINE: 85 mg/dL
Protein Creatinine Ratio: 0.18 mg/mg{Cre} — ABNORMAL HIGH (ref 0.00–0.15)
Total Protein, Urine: 15 mg/dL

## 2017-10-08 LAB — ABO/RH: ABO/RH(D): O POS

## 2017-10-08 MED ORDER — OXYTOCIN 40 UNITS IN LACTATED RINGERS INFUSION - SIMPLE MED
1.0000 m[IU]/min | INTRAVENOUS | Status: DC
  Administered 2017-10-08: 2 m[IU]/min via INTRAVENOUS
  Filled 2017-10-08: qty 1000

## 2017-10-08 MED ORDER — ONDANSETRON HCL 4 MG/2ML IJ SOLN: 4.0000 mg | Freq: Four times a day (QID) | INTRAMUSCULAR | Status: DC | PRN

## 2017-10-08 MED ORDER — PENICILLIN G POT IN DEXTROSE 60000 UNIT/ML IV SOLN
3.0000 10*6.[IU] | INTRAVENOUS | Status: DC
  Administered 2017-10-08 (×3): 3 10*6.[IU] via INTRAVENOUS
  Filled 2017-10-08 (×4): qty 50

## 2017-10-08 MED ORDER — LACTATED RINGERS IV SOLN: 500.0000 mL | Freq: Once | INTRAVENOUS | Status: DC

## 2017-10-08 MED ORDER — SIMETHICONE 80 MG PO CHEW: 80.0000 mg | CHEWABLE_TABLET | ORAL | Status: DC | PRN

## 2017-10-08 MED ORDER — COCONUT OIL OIL: 1.0000 "application " | TOPICAL_OIL | Status: DC | PRN

## 2017-10-08 MED ORDER — ACETAMINOPHEN 325 MG PO TABS: 650.0000 mg | ORAL_TABLET | ORAL | Status: DC | PRN

## 2017-10-08 MED ORDER — FENTANYL 2.5 MCG/ML BUPIVACAINE 1/10 % EPIDURAL INFUSION (WH - ANES)
INTRAMUSCULAR | Status: AC
  Filled 2017-10-08: qty 100

## 2017-10-08 MED ORDER — SODIUM CHLORIDE 0.9 % IV SOLN
5.0000 10*6.[IU] | Freq: Once | INTRAVENOUS | Status: AC
  Administered 2017-10-08: 5 10*6.[IU] via INTRAVENOUS
  Filled 2017-10-08: qty 5

## 2017-10-08 MED ORDER — DIBUCAINE 1 % RE OINT: 1.0000 "application " | TOPICAL_OINTMENT | RECTAL | Status: DC | PRN

## 2017-10-08 MED ORDER — TERBUTALINE SULFATE 1 MG/ML IJ SOLN
0.2500 mg | Freq: Once | INTRAMUSCULAR | Status: DC | PRN
  Filled 2017-10-08: qty 1

## 2017-10-08 MED ORDER — OXYCODONE-ACETAMINOPHEN 5-325 MG PO TABS: 2.0000 | ORAL_TABLET | ORAL | Status: DC | PRN

## 2017-10-08 MED ORDER — HYDROXYZINE HCL 50 MG PO TABS
50.0000 mg | ORAL_TABLET | Freq: Four times a day (QID) | ORAL | Status: DC | PRN
  Filled 2017-10-08: qty 1

## 2017-10-08 MED ORDER — MISOPROSTOL 25 MCG QUARTER TABLET
25.0000 ug | ORAL_TABLET | ORAL | Status: DC
Start: 2017-10-08 — End: 2017-10-08
  Administered 2017-10-08 (×2): 25 ug via VAGINAL
  Filled 2017-10-08 (×6): qty 1

## 2017-10-08 MED ORDER — LACTATED RINGERS IV SOLN
INTRAVENOUS | Status: DC
  Administered 2017-10-08 (×2): via INTRAVENOUS

## 2017-10-08 MED ORDER — PHENYLEPHRINE 40 MCG/ML (10ML) SYRINGE FOR IV PUSH (FOR BLOOD PRESSURE SUPPORT)
PREFILLED_SYRINGE | INTRAVENOUS | Status: AC
  Filled 2017-10-08: qty 20

## 2017-10-08 MED ORDER — IBUPROFEN 600 MG PO TABS
600.0000 mg | ORAL_TABLET | Freq: Four times a day (QID) | ORAL | Status: DC
  Administered 2017-10-09 – 2017-10-10 (×4): 600 mg via ORAL
  Filled 2017-10-08 (×4): qty 1

## 2017-10-08 MED ORDER — OXYTOCIN BOLUS FROM INFUSION
500.0000 mL | Freq: Once | INTRAVENOUS | Status: AC
  Administered 2017-10-08: 500 mL via INTRAVENOUS

## 2017-10-08 MED ORDER — FENTANYL 2.5 MCG/ML BUPIVACAINE 1/10 % EPIDURAL INFUSION (WH - ANES)
14.0000 mL/h | INTRAMUSCULAR | Status: DC | PRN
  Administered 2017-10-08: 14 mL/h via EPIDURAL

## 2017-10-08 MED ORDER — LACTATED RINGERS IV SOLN: 500.0000 mL | INTRAVENOUS | Status: DC | PRN

## 2017-10-08 MED ORDER — DIPHENHYDRAMINE HCL 50 MG/ML IJ SOLN: 12.5000 mg | INTRAMUSCULAR | Status: DC | PRN

## 2017-10-08 MED ORDER — WITCH HAZEL-GLYCERIN EX PADS: 1.0000 "application " | MEDICATED_PAD | CUTANEOUS | Status: DC | PRN

## 2017-10-08 MED ORDER — SENNOSIDES-DOCUSATE SODIUM 8.6-50 MG PO TABS
2.0000 | ORAL_TABLET | ORAL | Status: DC
  Administered 2017-10-09: 2 via ORAL
  Filled 2017-10-08: qty 2

## 2017-10-08 MED ORDER — ZOLPIDEM TARTRATE 5 MG PO TABS: 5.0000 mg | ORAL_TABLET | Freq: Every evening | ORAL | Status: DC | PRN

## 2017-10-08 MED ORDER — OXYCODONE HCL 5 MG PO TABS
5.0000 mg | ORAL_TABLET | ORAL | Status: DC | PRN
  Administered 2017-10-09 – 2017-10-10 (×5): 5 mg via ORAL
  Filled 2017-10-08 (×6): qty 1

## 2017-10-08 MED ORDER — BENZOCAINE-MENTHOL 20-0.5 % EX AERO: 1.0000 "application " | INHALATION_SPRAY | CUTANEOUS | Status: DC | PRN

## 2017-10-08 MED ORDER — OXYTOCIN 40 UNITS IN LACTATED RINGERS INFUSION - SIMPLE MED
2.5000 [IU]/h | INTRAVENOUS | Status: DC
  Administered 2017-10-08: 2.5 [IU]/h via INTRAVENOUS

## 2017-10-08 MED ORDER — ACETAMINOPHEN 325 MG PO TABS
650.0000 mg | ORAL_TABLET | ORAL | Status: DC | PRN
  Administered 2017-10-09: 650 mg via ORAL
  Filled 2017-10-08: qty 2

## 2017-10-08 MED ORDER — PRENATAL MULTIVITAMIN CH: 1.0000 | ORAL_TABLET | Freq: Every day | ORAL | Status: DC

## 2017-10-08 MED ORDER — PHENYLEPHRINE 40 MCG/ML (10ML) SYRINGE FOR IV PUSH (FOR BLOOD PRESSURE SUPPORT)
80.0000 ug | PREFILLED_SYRINGE | INTRAVENOUS | Status: DC | PRN
  Filled 2017-10-08: qty 5

## 2017-10-08 MED ORDER — TETANUS-DIPHTH-ACELL PERTUSSIS 5-2.5-18.5 LF-MCG/0.5 IM SUSP: 0.5000 mL | Freq: Once | INTRAMUSCULAR | Status: DC

## 2017-10-08 MED ORDER — LIDOCAINE HCL (PF) 1 % IJ SOLN
30.0000 mL | INTRAMUSCULAR | Status: DC | PRN
  Filled 2017-10-08: qty 30

## 2017-10-08 MED ORDER — ONDANSETRON HCL 4 MG PO TABS: 4.0000 mg | ORAL_TABLET | ORAL | Status: DC | PRN

## 2017-10-08 MED ORDER — OXYCODONE-ACETAMINOPHEN 5-325 MG PO TABS: 1.0000 | ORAL_TABLET | ORAL | Status: DC | PRN

## 2017-10-08 MED ORDER — EPHEDRINE 5 MG/ML INJ
10.0000 mg | INTRAVENOUS | Status: DC | PRN
  Filled 2017-10-08: qty 2

## 2017-10-08 MED ORDER — LIDOCAINE HCL (PF) 1 % IJ SOLN
INTRAMUSCULAR | Status: DC | PRN
  Administered 2017-10-08 (×2): 5 mL via EPIDURAL

## 2017-10-08 MED ORDER — DIPHENHYDRAMINE HCL 25 MG PO CAPS: 25.0000 mg | ORAL_CAPSULE | Freq: Four times a day (QID) | ORAL | Status: DC | PRN

## 2017-10-08 MED ORDER — FENTANYL CITRATE (PF) 100 MCG/2ML IJ SOLN
50.0000 ug | INTRAMUSCULAR | Status: DC | PRN
  Administered 2017-10-08 (×2): 100 ug via INTRAVENOUS
  Filled 2017-10-08 (×3): qty 2

## 2017-10-08 MED ORDER — ONDANSETRON HCL 4 MG/2ML IJ SOLN
4.0000 mg | INTRAMUSCULAR | Status: DC | PRN
  Administered 2017-10-09: 4 mg via INTRAVENOUS

## 2017-10-08 MED ORDER — SOD CITRATE-CITRIC ACID 500-334 MG/5ML PO SOLN: 30.0000 mL | ORAL | Status: DC | PRN

## 2017-10-08 MED ORDER — FLEET ENEMA 7-19 GM/118ML RE ENEM: 1.0000 | ENEMA | RECTAL | Status: DC | PRN

## 2017-10-08 NOTE — Anesthesia Preprocedure Evaluation (Signed)
Anesthesia Evaluation  Patient identified by MRN, date of birth, ID band Patient awake    Reviewed: Allergy & Precautions, H&P , NPO status , Patient's Chart, lab work & pertinent test results  Airway Mallampati: II   Neck ROM: full    Dental   Pulmonary asthma ,    breath sounds clear to auscultation       Cardiovascular hypertension,  Rhythm:regular Rate:Normal     Neuro/Psych    GI/Hepatic   Endo/Other  diabetes, GestationalMorbid obesity  Renal/GU      Musculoskeletal   Abdominal   Peds  Hematology   Anesthesia Other Findings   Reproductive/Obstetrics (+) Pregnancy                             Anesthesia Physical Anesthesia Plan  ASA: II  Anesthesia Plan: Epidural   Post-op Pain Management:    Induction: Intravenous  PONV Risk Score and Plan: 2 and Treatment may vary due to age or medical condition  Airway Management Planned: Natural Airway  Additional Equipment:   Intra-op Plan:   Post-operative Plan:   Informed Consent: I have reviewed the patients History and Physical, chart, labs and discussed the procedure including the risks, benefits and alternatives for the proposed anesthesia with the patient or authorized representative who has indicated his/her understanding and acceptance.     Plan Discussed with: Anesthesiologist  Anesthesia Plan Comments:         Anesthesia Quick Evaluation

## 2017-10-08 NOTE — Progress Notes (Signed)
Kelsey Hunter is a 42 y.o. [id number] at [redacted]w[redacted]d by LMP admitted for induction of labor due to A2GDM (Metformin).  Subjective: Pt states she is comfortable but aware of contractions, rating lower abdominal cramping 3-4/10. Declines analgesia, plans epidural.   Objective: BP (!) 153/100   Pulse 86   Temp 98.5 F (36.9 C) (Oral)   Resp 18   Ht  (1.549 m)   Wt 234 lb 8 oz (106.4 kg)   LMP 01/08/2017   BMI 44.31 kg/m  No intake/output data recorded. No intake/output data recorded.  FHT:  FHR: 125 bpm, variability: moderate,  accelerations:  Present,  decelerations:  Absent UC:   irregular, every 3-7 minutes SVE:   Dilation: 1 Effacement (%): Thick Station: -3 Exam by:: Viona Gilmore RN  Labs: Lab Results  Component Value Date   WBC 8.4 10/08/2017   HGB 11.7 (L) 10/08/2017   HCT 35.3 (L) 10/08/2017   MCV 86.1 10/08/2017   PLT 160 10/08/2017    Assessment / Plan: - GVS negative - Unripe cervix, Cytotec x1 placed - Plan foley bulb - Elevated BP this morning, Preeclampsia labs sent - Category 1: EFM - EFW 73% by MFM [place] at 36+2 GA - Planning inpatient BTL - Female "Bella Rose"/breast and bottle - Anticipate SVD   Calvert Cantor, CNM 10/08/2017, 0945 AM

## 2017-10-08 NOTE — Progress Notes (Signed)
Kelsey Hunter is a 42 y.o. [id number] at [redacted]w[redacted]d by LMP admitted for induction of labor due to Gestational diabetes.  Subjective: Patient is tolerating contractions, requesting epidural.  She denies headaches, SOB, visual changes, epigastric or RUQ pain  Objective: BP (!) 153/100   Pulse 86   Temp 98.6 F (37 C) (Oral)   Resp 18   Ht  (1.549 m)   Wt 106.4 kg (234 lb 8 oz)   LMP 01/08/2017   BMI 44.31 kg/m  No intake/output data recorded. No intake/output data recorded.  FHT:  FHR: 130 bpm, variability: moderate,  accelerations:  Present,  decelerations:  Present varaible UC:   irregular, every 3-4 minutes SVE:   Dilation: 1 Effacement (%): Thick Station: -3 Exam by:: [employer] RN  Labs: Lab Results  Component Value Date   WBC 8.4 10/08/2017   HGB 11.7 (L) 10/08/2017   HCT 35.3 (L) 10/08/2017   MCV 86.1 10/08/2017   PLT 160 10/08/2017    Assessment / Plan: Induction of labor due to gestational diabetes,  progressing well on cytotec  Labor: Progressing normally, will attempt to place FB HTN: BP ranging from 130-148/90-102, no signs or symptoms of preeclampsia, PIH labs wnl. Will continue to monitor Fetal Wellbeing:  Category II Pain Control:  Epidural ( planning) I/D:  n/a Anticipated MOD:  NSVD  [name], MD, PGY-1 Family Medicine - [facility] [place] 10/08/2017, 11:01 AM

## 2017-10-08 NOTE — Progress Notes (Signed)
Kelsey Hunter is a 42 y.o. [id number] at [redacted]w[redacted]d admitted for induction of labor due to A2GDM.  Subjective: S/p epidural, denies pain  Objective: BP (!) 135/92   Pulse 91   Temp 98.2 F (36.8 C) (Oral)   Resp 18   Ht  (1.549 m)   Wt 234 lb 8 oz (106.4 kg)   LMP 01/08/2017   SpO2 97%   BMI 44.31 kg/m  No intake/output data recorded. No intake/output data recorded.  FHT:  FHR: 130 bpm, variability: moderate,  accelerations:  Present,  decelerations:  Absent UC:   irregular, every 1.5-4 minutes SVE: Deferred  Labs: Lab Results  Component Value Date   WBC 8.3 10/08/2017   HGB 12.0 10/08/2017   HCT 36.5 10/08/2017   MCV 86.1 10/08/2017   PLT 161 10/08/2017    Assessment / Plan: Labor: Progressing normally, begin to augment with Pitocin Preeclampsia:  N/A Fetal Wellbeing:  Category I Pain Control:  Epidural I/D:  n/a Anticipated MOD:  NSVD  Calvert Cantor, CNM 10/08/2017, 3:46 PM

## 2017-10-08 NOTE — Progress Notes (Signed)
Kelsey Hunter is a 42 y.o. [id number] at [redacted]w[redacted]d by LMP admitted for induction of labor due to Gestational diabetes.  Subjective: Patient resting comfortable in bed, on epidural  Objective: BP 138/88   Pulse 91   Temp 98.2 F (36.8 C) (Oral)   Resp 18   Ht  (1.549 m)   Wt 106.4 kg (234 lb 8 oz)   LMP 01/08/2017   SpO2 97%   BMI 44.31 kg/m  No intake/output data recorded. No intake/output data recorded.  FHT:  FHR: 125 bpm, variability: moderate,  accelerations:  Present,  decelerations:  Absent UC:   irregular, every 3-4 minutes SVE:   Dilation: 4.5 Effacement (%): 60 Station: -1 Exam by:: Sam CNM  Labs: Lab Results  Component Value Date   WBC 8.3 10/08/2017   HGB 12.0 10/08/2017   HCT 36.5 10/08/2017   MCV 86.1 10/08/2017   PLT 161 10/08/2017    Assessment / Plan: Induction of labor due to gestational diabetes,  progressing well on pitocin  Labor: Progressing on Pitocin, AROM  HTN: BP ranging from 130-148/90-102, no signs or symptoms of preeclampsia, PIH labs wnl. Will continue to monitor Fetal Wellbeing:  Category I Pain Control:  Epidural I/D:  n/a Anticipated MOD:  NSVD  [name], MD, PGY-1 Family Medicine - [facility] [place] 10/08/2017, 6:45 PM

## 2017-10-08 NOTE — H&P (Addendum)
LABOR AND DELIVERY ADMISSION HISTORY AND PHYSICAL NOTE  Nitara Szczerba is a 42 y.o. female [phone] with IUP at 100w0d by LMP presenting for IOL for GDMA2 controled with metformin.  She reports positive fetal movement. She denies leakage of fluid or vaginal bleeding.  Prenatal History/Complications: PNC at [facility] Pregnancy complications:  - GDM controlled on oral hypoglycemic drug - Chronic HTN - Multigravida of AMA  Past Medical History: Past Medical History:  Diagnosis Date  . Asthma    inhaler when needed  . Hypertension     Past Surgical History: Past Surgical History:  Procedure Laterality Date  . CHOLECYSTECTOMY  03/2000  . TUBAL LIGATION     requested    Obstetrical History: OB History    Gravida  12   Para  6   Term  6   Preterm      AB  5   Living  6     SAB  5   TAB      Ectopic      Multiple      Live Births  6           Social History: Social History   Socioeconomic History  . Marital status: Single    Spouse name: Not on file  . Number of children: Not on file  . Years of education: Not on file  . Highest education level: Not on file  Occupational History  . Not on file  Social Needs  . Financial resource strain: Not on file  . Food insecurity:    Worry: Not on file    Inability: Not on file  . Transportation needs:    Medical: Not on file    Non-medical: Not on file  Tobacco Use  . Smoking status: Never Smoker  . Smokeless tobacco: Never Used  Substance and Sexual Activity  . Alcohol use: No  . Drug use: No  . Sexual activity: Yes    Birth control/protection: None  Lifestyle  . Physical activity:    Days per week: Not on file    Minutes per session: Not on file  . Stress: Not on file  Relationships  . Social connections:    Talks on phone: Not on file    Gets together: Not on file    Attends religious service: Not on file    Active member of club or organization: Not on file    Attends meetings of clubs or  organizations: Not on file    Relationship status: Not on file  Other Topics Concern  . Not on file  Social History Narrative  . Not on file    Family History: Family History  Problem Relation Age of Onset  . Kidney disease Father   . Heart disease Father     Allergies: No Known Allergies  Medications Prior to Admission  Medication Sig Dispense Refill Last Dose  . metFORMIN (GLUCOPHAGE) 500 MG tablet Take 1 tablet (500 mg total) by mouth daily with supper. 60 tablet 11 10/07/2017 at Unknown time  . ACCU-CHEK FASTCLIX LANCETS MISC 1 Device by Percutaneous route 4 (four) times daily. 100 each 12 Taking  . albuterol (PROVENTIL HFA;VENTOLIN HFA) 108 (90 Base) MCG/ACT inhaler Inhale 2 puffs into the lungs every 6 (six) hours as needed for wheezing or shortness of breath. (Patient not taking: Reported on 09/19/2017) 1 Inhaler 2 More than a month at Unknown time  . aspirin EC 81 MG tablet Take 81 mg by mouth daily.  Not Taking  . glucose blood (ACCU-CHEK GUIDE) test strip Use as instructed QID 100 each 12 Taking  . pantoprazole (PROTONIX) 20 MG tablet Take 1 tablet (20 mg total) by mouth daily. 30 tablet 4 Taking  . Prenatal Vit-Fe Fumarate-FA (PREPLUS) 27-1 MG TABS Take 1 tablet by mouth daily. 30 tablet 13 Taking     Review of Systems  All systems reviewed and negative except as stated in HPI  Physical Exam Blood pressure 140/75, pulse 93, temperature 98.5 F (36.9 C), temperature source Oral, resp. rate 18, height  (1.549 m), weight 106.4 kg (234 lb 8 oz), last menstrual period 01/08/2017. General appearance: alert, oriented, NAD Lungs: normal respiratory effort Heart: regular rate Abdomen: soft, non-tender; gravid, FH appropriate for GA Extremities: No calf swelling or tenderness Presentation: cephalic Fetal monitoring: 130, accels +, decel absent Uterine activity: irregular, every 4-2min Dilation: 1 Effacement (%): Thick Station: -3 Exam by:: Viona Gilmore RN  Prenatal  labs: ABO, Rh: --/--/O POS (05/29 4098) Antibody: PENDING (05/29 1191) Rubella: 2.07 (12/03 1440) RPR: Non Reactive (03/14 0817)  HBsAg: Negative (12/03 1440)  HIV: Non Reactive (03/14 0817)  GC/Chlamydia: negative GBS: Positive (05/10 1145)  2-hr GTT: 110 Genetic screening:  negative Anatomy US: normal  Prenatal Transfer Tool  Maternal Diabetes: Yes:  Diabetes Type:  Insulin/Medication controlled Genetic Screening: Normal Maternal Ultrasounds/Referrals: Normal Fetal Ultrasounds or other Referrals:  None Maternal Substance Abuse:  No Significant Maternal Medications:  Meds include: Protonix Significant Maternal Lab Results: None  Results for orders placed or performed during the hospital encounter of 10/08/17 (from the past 24 hour(s))  CBC   Collection Time: 10/08/17  8:17 AM  Result Value Ref Range   WBC 8.4 4.0 - 10.5 K/uL   RBC 4.10 3.87 - 5.11 MIL/uL   Hemoglobin 11.7 (L) 12.0 - 15.0 g/dL   HCT 47.8 (L) 29.5 - 62.1 %   MCV 86.1 78.0 - 100.0 fL   MCH 28.5 26.0 - 34.0 pg   MCHC 33.1 30.0 - 36.0 g/dL   RDW 30.8 65.7 - 84.6 %   Platelets 160 150 - 400 K/uL  Type and screen [facility]   Collection Time: 10/08/17  8:17 AM  Result Value Ref Range   ABO/RH(D) O POS    Antibody Screen PENDING    Sample Expiration      10/11/2017 Performed at [facility], [address] [place], [place] [address]     Patient Active Problem List   Diagnosis Date Noted  . Gestational diabetes mellitus, class A2 10/08/2017  . Gestational diabetes mellitus (GDM) in third trimester 08/07/2017  . Supervision of high risk pregnancy, antepartum 04/14/2017  . Chronic hypertension during pregnancy, antepartum 04/14/2017  . Advanced maternal age in multigravida 04/14/2017  . Encounter for supervision of pregnancy with grand mulitparity in second trimester, antepartum 04/14/2017  . Morbid obesity (HCC) 04/14/2017    Assessment: Tykesha Konicki is a 42 y.o. [id number] at  [redacted]w[redacted]d here for IOL for GDMA2  #Labor: on cytotec, will place FB when able #HTN - asymptomatic, monitoring BP, follow PIH labs #GDMA2 - controlled on Metformin, holding meds, monitoring CBGs #Pain: epidural #FWB: Cat I #ID:  GBS negative #MOF: breast/bottle #MOC:BTL   [name], MD, PGY-1 Family Medicine - [facility] [place] 10/08/2017, 8:52 AM

## 2017-10-08 NOTE — Anesthesia Procedure Notes (Signed)
Epidural Patient location during procedure: OB Start time: 10/08/2017 2:50 PM End time: 10/08/2017 2:59 PM  Staffing Anesthesiologist: Achille Rich, MD Performed: anesthesiologist   Preanesthetic Checklist Completed: patient identified, site marked, pre-op evaluation, timeout performed, IV checked, risks and benefits discussed and monitors and equipment checked  Epidural Patient position: sitting Prep: DuraPrep Patient monitoring: heart rate, cardiac monitor, continuous pulse ox and blood pressure Approach: midline Location: L2-L3 Injection technique: LOR saline  Needle:  Needle type: Tuohy  Needle gauge: 17 G Needle length: 9 cm Needle insertion depth: 7 cm Catheter type: closed end flexible Catheter size: 19 Gauge Catheter at skin depth: 12 cm Test dose: negative and Other  Assessment Events: blood not aspirated, injection not painful, no injection resistance and negative IV test  Additional Notes Informed consent obtained prior to proceeding including risk of failure, 1% risk of PDPH, risk of minor discomfort and bruising.  Discussed rare but serious complications including epidural abscess, permanent nerve injury, epidural hematoma.  Discussed alternatives to epidural analgesia and patient desires to proceed.  Timeout performed pre-procedure verifying patient name, procedure, and platelet count.  Patient tolerated procedure well. Reason for block:procedure for pain

## 2017-10-08 NOTE — Anesthesia Pain Management Evaluation Note (Signed)
  CRNA Pain Management Visit Note  Patient: Kelsey Hunter, 42 y.o., female  "Hello I am a member of the anesthesia team at [facility]. We have an anesthesia team available at all times to provide care throughout the hospital, including epidural management and anesthesia for C-section. I don't know your plan for the delivery whether it a natural birth, water birth, IV sedation, nitrous supplementation, doula or epidural, but we want to meet your pain goals."   1.Was your pain managed to your expectations on prior hospitalizations?   Yes   2.What is your expectation for pain management during this hospitalization?     Epidural  3.How can we help you reach that goal?   Record the patient's initial score and the patient's pain goal.   Pain: 3  Pain Goal: 5 [facility] wants you to be able to say your pain was always managed very well.  Laban Emperor 10/08/2017

## 2017-10-08 NOTE — Progress Notes (Signed)
Kelsey Hunter is a 42 y.o. [id number] at [redacted]w[redacted]d admitted for induction of labor due to A2GDM.  Subjective: Pt reports lower abdominal contraction pain 3-4/10, unchanged from previous assessment. Continues to plan for epidural but "not ready for it yet".  Objective: BP (!) 148/102   Pulse 91   Temp 98.6 F (37 C) (Oral)   Resp 16   Ht  (1.549 m)   Wt 234 lb 8 oz (106.4 kg)   LMP 01/08/2017   BMI 44.31 kg/m  No intake/output data recorded. No intake/output data recorded.  FHT:  FHR: 130 bpm, variability: moderate,  accelerations:  Present,  decelerations:  Absent UC:   irregular, every 1-3 minutes SVE:   Dilation: 3-4 Exam by: RN when foley bulb d/c'd  Labs: Lab Results  Component Value Date   WBC 8.4 10/08/2017   HGB 11.7 (L) 10/08/2017   HCT 35.3 (L) 10/08/2017   MCV 86.1 10/08/2017   PLT 160 10/08/2017    Assessment / Plan: - Foley bulb dislodged when traction applied - Labor: Progressing normally - Fetal Wellbeing:  Category I Pain Control:  planning epidural, currently satisfied with IV Analgesia (see MAR) I/D:  n/a Anticipated MOD:  NSVD  Calvert Cantor, CNM 10/08/2017, 12:43 PM

## 2017-10-08 NOTE — Progress Notes (Signed)
Kelsey Hunter is a 42 y.o. [id number] at [redacted]w[redacted]d admitted for induction of labor due to A2GDM.  Subjective: Denies pain, "excited" to have daughters visit this evening.  Objective: BP 138/88   Pulse 91   Temp 98.2 F (36.8 C) (Oral)   Resp 18   Ht  (1.549 m)   Wt 234 lb 8 oz (106.4 kg)   LMP 01/08/2017   SpO2 97%   BMI 44.31 kg/m  No intake/output data recorded. No intake/output data recorded.  FHT:  FHR: 125 bpm, variability: moderate,  accelerations:  Present,  decelerations:  Absent UC:   regular, every 1.5-3 minutes SVE:   Dilation: 4.5 Effacement (%): 60 Station: -1 Exam by:: Sam CNM  Labs: Lab Results  Component Value Date   WBC 8.3 10/08/2017   HGB 12.0 10/08/2017   HCT 36.5 10/08/2017   MCV 86.1 10/08/2017   PLT 161 10/08/2017    Assessment / Plan: Induction of labor due to A2GDM,  progressing well on pitocin  - AROM'd at 1836 Preeclampsia:  N/A Fetal Wellbeing:  Category I Pain Control:  Epidural I/D:  n/a Anticipated MOD:  NSVD  Calvert Cantor , CNM 10/08/2017, 6:44 PM

## 2017-10-08 NOTE — Progress Notes (Signed)
Kelsey Hunter is a 42 y.o. [id number] at [redacted]w[redacted]d admitted for induction of labor due to A2GDM.  Subjective: Reporting intermittent pressure for some contractions. Desires epidural before any further interventions.  Objective: BP (!) 157/106   Pulse 86   Temp 98.2 F (36.8 C) (Oral)   Resp 18   Ht  (1.549 m)   Wt 234 lb 8 oz (106.4 kg)   LMP 01/08/2017   BMI 44.31 kg/m  No intake/output data recorded. No intake/output data recorded.  FHT:  FHR: 135 bpm, variability: moderate,  accelerations:  Present,  decelerations:  Absent UC:   regular, every 2-4 minutes SVE:   Dilation: 3.5 Effacement (%): Thick Station: -3 Exam by:: Sam CNM  Labs: Lab Results  Component Value Date   WBC 8.4 10/08/2017   HGB 11.7 (L) 10/08/2017   HCT 35.3 (L) 10/08/2017   MCV 86.1 10/08/2017   PLT 160 10/08/2017    Assessment / Plan: Labor: Progressing normally, cervix persistently posterior. Plan Pitocin augmentation when s/p epidural Preeclampsia:  N/A Fetal Wellbeing:  Category I Pain Control:  requesting epidural now I/D:  n/a Anticipated MOD:  NSVD  Calvert Cantor, CNM 10/08/2017, 2:16 PM

## 2017-10-09 ENCOUNTER — Encounter (HOSPITAL_COMMUNITY)
Admission: RE | Disposition: A | Discharge status: Home / Self Care | Payer: Self-pay | Source: Home / Self Care | Attending: Obstetrics & Gynecology

## 2017-10-09 ENCOUNTER — Encounter (HOSPITAL_COMMUNITY): Payer: Self-pay

## 2017-10-09 ENCOUNTER — Inpatient Hospital Stay (HOSPITAL_COMMUNITY): Payer: Medicaid Other

## 2017-10-09 DIAGNOSIS — O134 Gestational [pregnancy-induced] hypertension without significant proteinuria, complicating childbirth: Secondary | ICD-10-CM | POA: Diagnosis not present

## 2017-10-09 DIAGNOSIS — Z302 Encounter for sterilization: Secondary | ICD-10-CM

## 2017-10-09 HISTORY — PX: TUBAL LIGATION: SHX77

## 2017-10-09 LAB — GLUCOSE, CAPILLARY
GLUCOSE-CAPILLARY: 69 mg/dL (ref 65–99)
Glucose-Capillary: 76 mg/dL (ref 65–99)

## 2017-10-09 SURGERY — LIGATION, FALLOPIAN TUBE, POSTPARTUM
Anesthesia: Epidural | Site: Abdomen | Laterality: Bilateral | Wound class: Clean

## 2017-10-09 MED ORDER — MIDAZOLAM HCL 2 MG/2ML IJ SOLN
INTRAMUSCULAR | Status: DC | PRN
  Administered 2017-10-09 (×2): 1 mg via INTRAVENOUS

## 2017-10-09 MED ORDER — MIDAZOLAM HCL 2 MG/2ML IJ SOLN
INTRAMUSCULAR | Status: AC
  Filled 2017-10-09: qty 2

## 2017-10-09 MED ORDER — FAMOTIDINE 20 MG PO TABS
40.0000 mg | ORAL_TABLET | Freq: Once | ORAL | Status: AC
  Administered 2017-10-09: 40 mg via ORAL
  Filled 2017-10-09: qty 2

## 2017-10-09 MED ORDER — ACETAMINOPHEN 325 MG PO TABS: 325.0000 mg | ORAL_TABLET | ORAL | Status: DC | PRN

## 2017-10-09 MED ORDER — PROPOFOL 10 MG/ML IV BOLUS
INTRAVENOUS | Status: AC
  Filled 2017-10-09: qty 20

## 2017-10-09 MED ORDER — LACTATED RINGERS IV SOLN
INTRAVENOUS | Status: DC
  Administered 2017-10-09 (×2): via INTRAVENOUS

## 2017-10-09 MED ORDER — FENTANYL CITRATE (PF) 100 MCG/2ML IJ SOLN
25.0000 ug | INTRAMUSCULAR | Status: DC | PRN
  Administered 2017-10-09: 50 ug via INTRAVENOUS

## 2017-10-09 MED ORDER — LIDOCAINE HCL (CARDIAC) PF 100 MG/5ML IV SOSY
PREFILLED_SYRINGE | INTRAVENOUS | Status: AC
  Filled 2017-10-09: qty 5

## 2017-10-09 MED ORDER — BUPIVACAINE HCL (PF) 0.5 % IJ SOLN
INTRAMUSCULAR | Status: AC
  Filled 2017-10-09: qty 30

## 2017-10-09 MED ORDER — METOCLOPRAMIDE HCL 10 MG PO TABS
10.0000 mg | ORAL_TABLET | Freq: Once | ORAL | Status: AC
  Administered 2017-10-09: 10 mg via ORAL
  Filled 2017-10-09: qty 1

## 2017-10-09 MED ORDER — BUPIVACAINE HCL (PF) 0.5 % IJ SOLN
INTRAMUSCULAR | Status: DC | PRN
  Administered 2017-10-09: 25 mL

## 2017-10-09 MED ORDER — FENTANYL CITRATE (PF) 100 MCG/2ML IJ SOLN
INTRAMUSCULAR | Status: AC
  Filled 2017-10-09: qty 2

## 2017-10-09 MED ORDER — PHENYLEPHRINE 40 MCG/ML (10ML) SYRINGE FOR IV PUSH (FOR BLOOD PRESSURE SUPPORT)
PREFILLED_SYRINGE | INTRAVENOUS | Status: AC
  Filled 2017-10-09: qty 10

## 2017-10-09 MED ORDER — ACETAMINOPHEN 160 MG/5ML PO SOLN: 325.0000 mg | ORAL | Status: DC | PRN

## 2017-10-09 MED ORDER — PHENYLEPHRINE 40 MCG/ML (10ML) SYRINGE FOR IV PUSH (FOR BLOOD PRESSURE SUPPORT)
PREFILLED_SYRINGE | INTRAVENOUS | Status: DC | PRN
  Administered 2017-10-09: 80 ug via INTRAVENOUS

## 2017-10-09 MED ORDER — SODIUM CHLORIDE 0.9 % IR SOLN
Status: DC | PRN
  Administered 2017-10-09: 1000 mL

## 2017-10-09 MED ORDER — FENTANYL CITRATE (PF) 100 MCG/2ML IJ SOLN
INTRAMUSCULAR | Status: DC | PRN
  Administered 2017-10-09 (×3): 50 ug via INTRAVENOUS

## 2017-10-09 MED ORDER — ONDANSETRON HCL 4 MG/2ML IJ SOLN: 4.0000 mg | Freq: Once | INTRAMUSCULAR | Status: DC | PRN

## 2017-10-09 MED ORDER — OXYCODONE HCL 5 MG PO TABS: 5.0000 mg | ORAL_TABLET | Freq: Once | ORAL | Status: DC | PRN

## 2017-10-09 MED ORDER — ONDANSETRON HCL 4 MG/2ML IJ SOLN
INTRAMUSCULAR | Status: AC
Start: 2017-10-09 — End: ?
  Filled 2017-10-09: qty 2

## 2017-10-09 MED ORDER — SODIUM BICARBONATE 8.4 % IV SOLN
INTRAVENOUS | Status: DC | PRN
  Administered 2017-10-09 (×3): 5 mL via EPIDURAL
  Administered 2017-10-09: 2 mL via EPIDURAL
  Administered 2017-10-09: 5 mL via EPIDURAL
  Administered 2017-10-09: 3 mL via EPIDURAL
  Administered 2017-10-09: 5 mL via EPIDURAL

## 2017-10-09 MED ORDER — MEPERIDINE HCL 25 MG/ML IJ SOLN: 6.2500 mg | INTRAMUSCULAR | Status: DC | PRN

## 2017-10-09 MED ORDER — OXYCODONE HCL 5 MG/5ML PO SOLN: 5.0000 mg | Freq: Once | ORAL | Status: DC | PRN

## 2017-10-09 MED ORDER — PNEUMOCOCCAL VAC POLYVALENT 25 MCG/0.5ML IJ INJ
0.5000 mL | INJECTION | INTRAMUSCULAR | Status: DC
  Filled 2017-10-09: qty 0.5

## 2017-10-09 SURGICAL SUPPLY — 24 items
CHLORAPREP W/TINT 26ML (MISCELLANEOUS) ×3 IMPLANT
CLIP FILSHIE TUBAL LIGA STRL (Clip) ×3 IMPLANT
CLOTH BEACON ORANGE TIMEOUT ST (SAFETY) ×3 IMPLANT
DRSG OPSITE POSTOP 3X4 (GAUZE/BANDAGES/DRESSINGS) ×3 IMPLANT
GLOVE BIOGEL PI IND STRL 7.0 (GLOVE) ×3 IMPLANT
GLOVE BIOGEL PI IND STRL 7.5 (GLOVE) ×1 IMPLANT
GLOVE BIOGEL PI INDICATOR 7.0 (GLOVE) ×6
GLOVE BIOGEL PI INDICATOR 7.5 (GLOVE) ×2
GOWN STRL REUS W/TWL LRG LVL3 (GOWN DISPOSABLE) ×6 IMPLANT
NEEDLE HYPO 22GX1.5 SAFETY (NEEDLE) ×3 IMPLANT
NS IRRIG 1000ML POUR BTL (IV SOLUTION) ×3 IMPLANT
PACK ABDOMINAL MINOR (CUSTOM PROCEDURE TRAY) ×3 IMPLANT
PENCIL BUTTON HOLSTER BLD 10FT (ELECTRODE) ×3 IMPLANT
SPONGE LAP 18X18 RF (DISPOSABLE) ×3 IMPLANT
SPONGE LAP 4X18 X RAY DECT (DISPOSABLE) ×3 IMPLANT
SUT MON AB 4-0 PS1 27 (SUTURE) ×3 IMPLANT
SUT PLAIN 0 NONE (SUTURE) ×3 IMPLANT
SUT PLAIN 2 0 (SUTURE) ×6
SUT PLAIN ABS 2-0 CT1 27XMFL (SUTURE) ×3 IMPLANT
SUT VICRYL 0 UR6 27IN ABS (SUTURE) ×3 IMPLANT
SYR CONTROL 10ML LL (SYRINGE) ×3 IMPLANT
TOWEL OR 17X24 6PK STRL BLUE (TOWEL DISPOSABLE) ×6 IMPLANT
TRAY FOLEY CATH SILVER 14FR (SET/KITS/TRAYS/PACK) ×3 IMPLANT
WATER STERILE IRR 1000ML POUR (IV SOLUTION) ×3 IMPLANT

## 2017-10-09 NOTE — Lactation Note (Signed)
This note was copied from a baby's chart. Lactation Consultation Note  Patient Name: Kelsey Hunter WGNFA'O Date: 10/09/2017 Reason for consult: Initial assessment;Term  P7 mother whose infant is now 29 hours old.  Mother's children range in ages from 4-27. She had her first child at age 42.  She did not breastfeed her first 3 children but did breastfeed the last 3 children.  She plans to breastfeed this baby as long as possible.  Mother will be having a tubal later today.  Mother does not have any questions/concerns regarding breastfeeding.  Encouraged her to feed 8-12 times/24 hours or more often if baby shows cues.  Mother aware of feeding cues.  Continue STS, breast massage and hand expression.  Mom made aware of O/P services, breastfeeding support groups, community resources, and our phone # for post-discharge questions. Mother will call for assistance as needed.  FOB not present but is supportive according to mother.     Maternal Data Formula Feeding for Exclusion: No Has patient been taught Hand Expression?: Yes Does the patient have breastfeeding experience prior to this delivery?: Yes  Feeding Feeding Type: Breast Fed Length of feed: 5 min  LATCH Score Latch: Grasps breast easily, tongue down, lips flanged, rhythmical sucking.  Audible Swallowing: Spontaneous and intermittent  Type of Nipple: Everted at rest and after stimulation  Comfort (Breast/Nipple): Soft / non-tender  Hold (Positioning): No assistance needed to correctly position infant at breast.  LATCH Score: 10  Interventions    Lactation Tools Discussed/Used WIC Program: Yes   Consult Status Consult Status: Follow-up Date: 10/10/17 Follow-up type: In-patient    Kelsey Hunter Kelsey Hunter 10/09/2017, 2:43 AM

## 2017-10-09 NOTE — Progress Notes (Signed)
POSTPARTUM PROGRESS NOTE  Post Partum Day 1 Subjective:  Kelsey Hunter is a 42 y.o. [id number] [redacted]w[redacted]d s/p SVD after IOL for A2GDM.  No acute events overnight.  Pt denies problems with ambulating, voiding or po intake.  She denies nausea or vomiting.  Pain is moderately controlled.  She has had flatus. She has not had bowel movement.  Lochia Minimal.   Objective: Blood pressure 125/78, pulse 72, temperature 98.1 F (36.7 C), temperature source Oral, resp. rate 18, height  (1.549 m), weight 234 lb 8 oz (106.4 kg), last menstrual period 01/08/2017, SpO2 97 %, currently breastfeeding.  Physical Exam:  General: alert, cooperative and no distress Lochia:normal flow Chest: CTAB Heart: RRR no m/r/g Abdomen: +BS, soft, nontender,  Uterine Fundus: firm, non-tender uterus DVT Evaluation: No calf swelling or tenderness Extremities: no evidence of edema  Recent Labs    10/08/17 0817 10/08/17 1424  HGB 11.7* 12.0  HCT 35.3* 36.5    Assessment/Plan:  ASSESSMENT: Kelsey Hunter is a 42 y.o. [id number] [redacted]w[redacted]d s/p SVD after IOL for A2GDM. Patient stable and doing well.   Plan for discharge tomorrow, Breastfeeding, and Contraception: scheduled for bilateral tubal ligation today  GDM: patient stable; most recent blood glucose today was 76. Pain: Patient in moderate pain but does not pain medication   LOS: 1 day   Cyndee Brightly, Medical Student 10/09/2017, 10:02 AM   OB FELLOW POSTPARTUM PROGRESS NOTE ATTESTATION  I confirm that I have verified the information documented in the medical student's note and that I have also personally reperformed the physical exam and all medical decision making activities.   Caryl Ada, DO OB Fellow Center for [facility], [facility]

## 2017-10-09 NOTE — Progress Notes (Signed)
Faculty Note  Patient is a 42 y.o. [id number] who is PPD#1 s/p SVD s/p IOL for cHTN and gDMA2.  In to see patient, she again affirms desire to have bilateral tubal ligation done. She understands that this is a permanent procedure and she will not be able to have children after it is done. Reviewed risks of bilateral tubal ligation including infection, hemorrhage, damage to surrounding tissue and organs, risk of regret. Reviewed that bilateral tubal ligation is not 100% effective and she should take a pregnancy test if she believes for any reason she may be pregnant. Reviewed slightly increased risk of ectopic pregnancy and need to seek care if she becomes pregnant. She understands this is an elective procedure and again affirms her desire. Consent signed, also consents to blood transfusion if necessary.   She is NPO To OR when ready   K. Therese Sarah, M.D. Attending Obstetrician & Gynecologist, [facility] for [employer], [facility]

## 2017-10-09 NOTE — Anesthesia Preprocedure Evaluation (Signed)
Anesthesia Evaluation  Patient identified by MRN, date of birth, ID band Patient awake    Reviewed: Allergy & Precautions, H&P , NPO status , Patient's Chart, lab work & pertinent test results  Airway Mallampati: II   Neck ROM: full    Dental   Pulmonary asthma ,    breath sounds clear to auscultation       Cardiovascular hypertension,  Rhythm:regular Rate:Normal     Neuro/Psych    GI/Hepatic   Endo/Other  diabetes, GestationalMorbid obesity  Renal/GU      Musculoskeletal   Abdominal   Peds  Hematology   Anesthesia Other Findings   Reproductive/Obstetrics (+) Pregnancy                             Anesthesia Physical Anesthesia Plan  ASA: II  Anesthesia Plan: Epidural   Post-op Pain Management:    Induction: Intravenous  PONV Risk Score and Plan: 2 and Treatment may vary due to age or medical condition  Airway Management Planned: Natural Airway  Additional Equipment:   Intra-op Plan:   Post-operative Plan:   Informed Consent: I have reviewed the patients History and Physical, chart, labs and discussed the procedure including the risks, benefits and alternatives for the proposed anesthesia with the patient or authorized representative who has indicated his/her understanding and acceptance.     Plan Discussed with: Anesthesiologist  Anesthesia Plan Comments:         Anesthesia Quick Evaluation  

## 2017-10-09 NOTE — Op Note (Signed)
Operative Note   10/09/2017  PRE-OP DIAGNOSIS: Desire for permanent sterilization.  Postpartum Day #1 BMI 44 Desire for permanent sterilization   POST-OP DIAGNOSIS: Same. Pelvic adhesive disease  SURGEON: Surgeon(s) and Role:    * [place] Bing, MD - Primary  ASSISTANT: None  ANESTHESIA: epidural and local  PROCEDURE: mini-laparotomy, bilateral tubal ligation via filshie clip on the left and modified parkland and filshie on the right  ESTIMATED BLOOD LOSS: 10mL  DRAINS: indwelling foley, UOP per anesthesia note  TOTAL IV FLUIDS: per anesthesia note   SPECIMENS:  Portion of right fallopian tube  VTE PROPHYLAXIS: SCDs to the bilateral lower extremities  ANTIBIOTICS: not indicated  COMPLICATIONS: none  DISPOSITION: PACU - hemodynamically stable.  CONDITION: stable  FINDINGS: pelvic adhesive disease causing the right fallopian tube and ovary to be adhesed to the posterior uterus. Left fallopian tube, fimbriae normal.  Smooth, normally contoured uterine fundus.   +lumen noted on cross section of right tube. Uterus was 4cm above the umbilicus.   PROCEDURE IN DETAIL: The patient was taken to the OR where anesthesia was administed. The patient was positioned in dorsal supine. The patient was prepped and draped in the normal sterile fashion and a 4cm horizontal incision was made at the superior aspect of the umbilicus with the 2 finger breadths below the umbilicus, after injection with local anesthesia. The skin was then incised with the scalpel and the underlying tissue dissected bluntly and the fascia nicked in the midline with the scalpel and then extended laterally bluntly.    The abdomen was then entered bluntly and a moist lap sponge used to displace the bowel to the left and right upper quadrant of the abdomen after airplaning the patient to her left. What was seen and identified as the likely the right tube was seen but was stuck posteriorly and there was minimal  slack and movement of the tube and I was unable to definitively see the fimbriae; scar tissue was seen adhesing the tube to the posterior aspect of the uterus. I palpated the right round ligament in the appropriate place. I went to the other side, after removing the sponge and airplaning her to her right and the left tube was easily seen in the same area as where I thought the tube was on the right side. The left tube was easily traced out the fimbriae and a filshie clip placed over an avascular section of tube, approximately 4cm from the cornua.  I went back to the patient's left side, removed the sponge, and airplaned the patient to the left side and placed the sponge back into the abdomen. A parkland was done after grasping a segment of the tube approximately 2-3cm from the cornua; this is the only area that had any slack and an avascular segment. The tubal segment to be removed was suture ligated with 2-0 plain gut on either ends and metzenbaum scissors used to remove a segment of the tube. A filshie clip was placed on the stump nearest the cornua since it was already open and in order to ensure extra hemostasis.   The lap sponge was then removed from the abdomen and the fascia closed in running fashion with 0 vicryl and the subcutaneous tissue irrigated and closed with figure of eight suture of 3-0 plain gut. The skin was then closed with 4-0 monocry   The patient tolerated the procedure well. All counts were correct x 2. The patient was transferred to the recovery room awake, alert and breathing  independently.   Cornelia Copa MD Attending Center for [employer] Midwife)

## 2017-10-09 NOTE — Anesthesia Postprocedure Evaluation (Signed)
Anesthesia Post Note  Patient: Kelsey Hunter  Procedure(s) Performed: AN AD HOC LABOR EPIDURAL     Patient location during evaluation: Mother Baby Anesthesia Type: Epidural Level of consciousness: awake and alert Pain management: pain level controlled Vital Signs Assessment: post-procedure vital signs reviewed and stable Respiratory status: spontaneous breathing, nonlabored ventilation and respiratory function stable Cardiovascular status: stable Postop Assessment: no headache, no backache, epidural receding, no apparent nausea or vomiting, patient able to bend at knees, able to ambulate and adequate PO intake Anesthetic complications: no    Last Vitals:  Vitals:   10/09/17 0015 10/09/17 0500  BP: (!) 147/83 125/78  Pulse: 85 72  Resp: 20 18  Temp: 36.7 C 36.7 C  SpO2:      Last Pain:  Vitals:   10/09/17 0500  TempSrc: Oral  PainSc: 3    Pain Goal: Patients Stated Pain Goal: 5 (10/08/17 1510)               [name]

## 2017-10-09 NOTE — Anesthesia Postprocedure Evaluation (Signed)
Anesthesia Post Note  Patient: Kelsey Hunter  Procedure(s) Performed: POST PARTUM TUBAL LIGATION (Bilateral Abdomen)     Patient location during evaluation: Mother Baby Anesthesia Type: Epidural Level of consciousness: awake and alert Pain management: pain level controlled Vital Signs Assessment: post-procedure vital signs reviewed and stable Respiratory status: spontaneous breathing, nonlabored ventilation and respiratory function stable Cardiovascular status: stable Postop Assessment: no headache, no backache and epidural receding Anesthetic complications: no    Last Vitals:  Vitals:   10/09/17 1415 10/09/17 1430  BP: (!) 146/88 (!) 148/89  Pulse: 80 82  Resp: 17 18  Temp:  36.7 C  SpO2: 97% 100%    Last Pain:  Vitals:   10/09/17 1500  TempSrc:   PainSc: 3    Pain Goal: Patients Stated Pain Goal: 5 (10/08/17 1510)               Remas Sobel

## 2017-10-09 NOTE — Transfer of Care (Signed)
Immediate Anesthesia Transfer of Care Note  Patient: Kelsey Hunter  Procedure(s) Performed: POST PARTUM TUBAL LIGATION (Bilateral Abdomen)  Patient Location: PACU  Anesthesia Type:Epidural  Level of Consciousness: sedated  Airway & Oxygen Therapy: Patient Spontanous Breathing  Post-op Assessment: Report given to RN and Post -op Vital signs reviewed and stable  Post vital signs: Reviewed and stable  Last Vitals:  Vitals Value Taken Time  BP    Temp    Pulse 87 10/09/2017  1:37 PM  Resp 20 10/09/2017  1:37 PM  SpO2 98 % 10/09/2017  1:37 PM  Vitals shown include unvalidated device data.  Last Pain:  Vitals:   10/09/17 0500  TempSrc: Oral  PainSc: 3       Patients Stated Pain Goal: 5 (10/08/17 1510)  Complications: No apparent anesthesia complications

## 2017-10-09 NOTE — Progress Notes (Signed)
Pt seen and examined.   NAD Abdomen: obese, nttp, firm fundus approx -3cm below the umbilicus.   R/B/A d/w her, and increased due to her body habitus and she still desires BTL. Papers UTD. Can proceed when OR is ready.   Cornelia Copa MD [facility] [employer] (Faculty Practice) 10/09/2017 Time: [phone]

## 2017-10-10 ENCOUNTER — Encounter (HOSPITAL_COMMUNITY): Payer: Self-pay | Admitting: Obstetrics and Gynecology

## 2017-10-10 MED ORDER — TETANUS-DIPHTH-ACELL PERTUSSIS 5-2.5-18.5 LF-MCG/0.5 IM SUSP: 0.5000 mL | Freq: Once | INTRAMUSCULAR | 0 refills | Status: AC

## 2017-10-10 MED ORDER — PNEUMOCOCCAL VAC POLYVALENT 25 MCG/0.5ML IJ INJ: 0.5000 mL | INJECTION | INTRAMUSCULAR | 0 refills | Status: AC

## 2017-10-10 MED ORDER — IBUPROFEN 600 MG PO TABS: 600.0000 mg | ORAL_TABLET | Freq: Four times a day (QID) | ORAL | 0 refills | Status: DC

## 2017-10-10 MED ORDER — ACETAMINOPHEN 325 MG PO TABS: 650.0000 mg | ORAL_TABLET | ORAL | 0 refills | Status: DC | PRN

## 2017-10-10 MED ORDER — AMLODIPINE BESYLATE 5 MG PO TABS: 5.0000 mg | ORAL_TABLET | Freq: Every day | ORAL | 11 refills | Status: DC

## 2017-10-10 MED ORDER — OXYCODONE HCL 5 MG PO TABS: 5.0000 mg | ORAL_TABLET | ORAL | 0 refills | Status: DC | PRN

## 2017-10-10 NOTE — Anesthesia Postprocedure Evaluation (Signed)
Anesthesia Post Note  Patient: Kelsey LangoSandra Pixler  Procedure(s) Performed: POST PARTUM TUBAL LIGATION (Bilateral Abdomen)     Patient location during evaluation: Mother Baby Anesthesia Type: Epidural Level of consciousness: awake Pain management: pain level controlled Vital Signs Assessment: post-procedure vital signs reviewed and stable Respiratory status: spontaneous breathing Cardiovascular status: stable Postop Assessment: epidural receding and patient able to bend at knees Anesthetic complications: no    Last Vitals:  Vitals:   10/10/17 0557 10/10/17 0724  BP: (!) 144/90 (!) 141/95  Pulse: 79 85  Resp: 18 20  Temp: 36.8 C 36.7 C  SpO2:      Last Pain:  Vitals:   10/10/17 0724  TempSrc: Axillary  PainSc: 8    Pain Goal: Patients Stated Pain Goal: 5 (10/08/17 1510)               Edison PaceWILKERSON,Sanad Fearnow

## 2017-10-10 NOTE — Discharge Instructions (Signed)
Vaginal Delivery, Care After °Refer to this sheet in the next few weeks. These instructions provide you with information about caring for yourself after vaginal delivery. Your health care provider may also give you more specific instructions. Your treatment has been planned according to current medical practices, but problems sometimes occur. Call your health care provider if you have any problems or questions. °What can I expect after the procedure? °After vaginal delivery, it is common to have: °· Some bleeding from your vagina. °· Soreness in your abdomen, your vagina, and the area of skin between your vaginal opening and your anus (perineum). °· Pelvic cramps. °· Fatigue. ° °Follow these instructions at home: °Medicines °· Take over-the-counter and prescription medicines only as told by your health care provider. °· If you were prescribed an antibiotic medicine, take it as told by your health care provider. Do not stop taking the antibiotic until it is finished. °Driving ° °· Do not drive or operate heavy machinery while taking prescription pain medicine. °· Do not drive for 24 hours if you received a sedative. °Lifestyle °· Do not drink alcohol. This is especially important if you are breastfeeding or taking medicine to relieve pain. °· Do not use tobacco products, including cigarettes, chewing tobacco, or e-cigarettes. If you need help quitting, ask your health care provider. °Eating and drinking °· Drink at least 8 eight-ounce glasses of water every day unless you are told not to by your health care provider. If you choose to breastfeed your baby, you may need to drink more water than this. °· Eat high-fiber foods every day. These foods may help prevent or relieve constipation. High-fiber foods include: °? Whole grain cereals and breads. °? Brown rice. °? Beans. °? Fresh fruits and vegetables. °Activity °· Return to your normal activities as told by your health care provider. Ask your health care provider  what activities are safe for you. °· Rest as much as possible. Try to rest or take a nap when your baby is sleeping. °· Do not lift anything that is heavier than your baby or 10 lb (4.5 kg) until your health care provider says that it is safe. °· Talk with your health care provider about when you can engage in sexual activity. This may depend on your: °? Risk of infection. °? Rate of healing. °? Comfort and desire to engage in sexual activity. °Vaginal Care °· If you have an episiotomy or a vaginal tear, check the area every day for signs of infection. Check for: °? More redness, swelling, or pain. °? More fluid or blood. °? Warmth. °? Pus or a bad smell. °· Do not use tampons or douches until your health care provider says this is safe. °· Watch for any blood clots that may pass from your vagina. These may look like clumps of dark red, brown, or black discharge. °General instructions °· Keep your perineum clean and dry as told by your health care provider. °· Wear loose, comfortable clothing. °· Wipe from front to back when you use the toilet. °· Ask your health care provider if you can shower or take a bath. If you had an episiotomy or a perineal tear during labor and delivery, your health care provider may tell you not to take baths for a certain length of time. °· Wear a bra that supports your breasts and fits you well. °· If possible, have someone help you with household activities and help care for your baby for at least a few days after   you leave the hospital.  Keep all follow-up visits for you and your baby as told by your health care provider. This is important. Contact a health care provider if:  You have: ? Vaginal discharge that has a bad smell. ? Difficulty urinating. ? Pain when urinating. ? A sudden increase or decrease in the frequency of your bowel movements. ? More redness, swelling, or pain around your episiotomy or vaginal tear. ? More fluid or blood coming from your episiotomy or  vaginal tear. ? Pus or a bad smell coming from your episiotomy or vaginal tear. ? A fever. ? A rash. ? Little or no interest in activities you used to enjoy. ? Questions about caring for yourself or your baby.  Your episiotomy or vaginal tear feels warm to the touch.  Your episiotomy or vaginal tear is separating or does not appear to be healing.  Your breasts are painful, hard, or turn red.  You feel unusually sad or worried.  You feel nauseous or you vomit.  You pass large blood clots from your vagina. If you pass a blood clot from your vagina, save it to show to your health care provider. Do not flush blood clots down the toilet without having your health care provider look at them.  You urinate more than usual.  You are dizzy or light-headed.  You have not breastfed at all and you have not had a menstrual period for 12 weeks after delivery.  You have stopped breastfeeding and you have not had a menstrual period for 12 weeks after you stopped breastfeeding. Get help right away if:  You have: ? Pain that does not go away or does not get better with medicine. ? Chest pain. ? Difficulty breathing. ? Blurred vision or spots in your vision. ? Thoughts about hurting yourself or your baby.  You develop pain in your abdomen or in one of your legs.  You develop a severe headache.  You faint.  You bleed from your vagina so much that you fill two sanitary pads in one hour. This information is not intended to replace advice given to you by your health care provider. Make sure you discuss any questions you have with your health care provider. Document Released: 04/26/2000 Document Revised: 10/11/2015 Document Reviewed: 05/14/2015 Elsevier Interactive Patient Education  2018 [employer]  Bilateral Salpingo-Oophorectomy, Care After This sheet gives you information about how to care for yourself after your procedure. Your health care provider may also give you more specific  instructions. If you have problems or questions, contact your health care provider. What can I expect after the procedure? After the procedure, it is common to have:  Abdominal pain.  Some occasional vaginal bleeding (spotting).  Tiredness.  Symptoms of menopause, such as hot flashes, night sweats, or mood swings.  Follow these instructions at home: Incision care  Keep your incision area and your bandage (dressing) clean and dry.  Follow instructions from your health care provider about how to take care of your incision. Make sure you: ? Wash your hands with soap and water before you change your dressing. If soap and water are not available, use hand sanitizer. ? Change your dressing as told by your health care provider. ? Leave stitches (sutures), staples, skin glue, or adhesive strips in place. These skin closures may need to stay in place for 2 weeks or longer. If adhesive strip edges start to loosen and curl up, you may trim the loose edges. Do not remove adhesive strips  completely unless your health care provider tells you to do that.  Check your incision area every day for signs of infection. Check for: ? Redness, swelling, or pain. ? Fluid or blood. ? Warmth. ? Pus or a bad smell. Activity  Do not drive or use heavy machinery while taking prescription pain medicine.  Do not drive for 24 hours if you received a medicine to help you relax (sedative) during your procedure.  Take frequent, short walks throughout the day. Rest when you get tired. Ask your health care provider what activities are safe for you.  Avoid activity that requires great effort. Also, avoid heavy lifting. Do not lift anything that is heavier than 10 lbs. (4.5 kg), or the limit that your health care provider tells you, until he or she says that it is safe to do so.  Do not douche, use tampons, or have sex until your health care provider approves. General instructions  To prevent or treat constipation  while you are taking prescription pain medicine, your health care provider may recommend that you: ? Drink enough fluid to keep your urine clear or pale yellow. ? Take over-the-counter or prescription medicines. ? Eat foods that are high in fiber, such as fresh fruits and vegetables, whole grains, and beans. ? Limit foods that are high in fat and processed sugars, such as fried and sweet foods.  Take over-the-counter and prescription medicines only as told by your health care provider.  Do not take baths, swim, or use a hot tub until your health care provider approves. Ask your health care provider if you can take showers. You may only be allowed to take sponge baths for bathing.  Wear compression stockings as told by your health care provider. These stockings help to prevent blood clots and reduce swelling in your legs.  Keep all follow-up visits as told by your health care provider. This is important. Contact a health care provider if:  You have pain when you urinate.  You have pus or a bad smelling discharge coming from your vagina.  You have redness, swelling, or pain around your incision.  You have fluid or blood coming from your incision.  Your incision feels warm to the touch.  You have pus or a bad smell coming from your incision.  You have a fever.  Your incision starts to break open.  You have pain in the abdomen, and it gets worse or does not get better when you take medicine.  You develop a rash.  You develop nausea and vomiting.  You feel lightheaded. Get help right away if:  You develop pain in your chest or leg.  You become short of breath.  You faint.  You have increased bleeding from your vagina. Summary  After the procedure, it is common to have pain, bleeding in the vagina, and symptoms of menopause.  Follow instructions from your health care provider about how to take care of your incision.  Follow instructions from your health care provider  about activities and restrictions.  Check your incision every day for signs of infection and report any symptoms to your health care provider. This information is not intended to replace advice given to you by your health care provider. Make sure you discuss any questions you have with your health care provider. Document Released: 04/29/2005 Document Revised: 06/03/2016 Document Reviewed: 06/03/2016 Elsevier Interactive Patient Education  [employer].

## 2017-10-10 NOTE — Addendum Note (Signed)
Addendum  created 10/10/17 0834 by Earmon Phoenix, CRNA   Charge Capture section accepted, Sign clinical note

## 2017-10-10 NOTE — Discharge Summary (Addendum)
OB Discharge Summary     Patient Name: Kelsey Hunter DOB: 05-29-1975 MRN: [id number]  Date of admission: 10/08/2017 Delivering MD: Katheren Shams   Date of discharge: 10/10/2017  Admitting diagnosis: INDUCTION Intrauterine pregnancy: [redacted]w[redacted]d    Secondary diagnosis:  Active Problems:   Supervision of high risk pregnancy, antepartum   Chronic hypertension during pregnancy, antepartum   Advanced maternal age in multigravida   Gestational diabetes mellitus, class A2   SVD (spontaneous vaginal delivery)  Additional problems: Persistent HTN, postpartem     Discharge diagnosis: Term Pregnancy Delivered, CHTN and GDM A2                                                                                                Post partum procedures:postpartum tubal ligation  Augmentation: AROM, Pitocin, Cytotec and Foley Balloon  Complications: None  Hospital course:  Induction of Labor With Vaginal Delivery   42y.o. yo [id number] admitted to the hospital 10/08/2017 for induction of labor.  Indication for induction: A2 DM and Chronic HTN.  Patient had an uncomplicated labor course as follows: Membrane Rupture Time/Date: 6:36 PM ,10/08/2017   Intrapartum Procedures: Episiotomy: None [1]                                         Lacerations:  None [1]  Patient had delivery of a Viable infant.  Information for the patient's newborn:  LaMarlene, Beidler0[[id number]]Delivery Method: Vaginal, Spontaneous(Filed from Delivery Summary)   10/08/2017  Details of delivery can be found in separate delivery note.  Patient had a routine postpartum course. Pt did have persistant HTN. Pt was asymptomatic from this stadn point. Pt was started on amlodipine at discharge. Patient is discharged home 10/10/17.  Physical exam  Vitals:   10/09/17 1430 10/09/17 2245 10/09/17 2350 10/10/17 0557  BP: (!) 148/89 (!) 167/101 132/87 (!) 144/90  Pulse: 82 86 76 79  Resp: '18 20 18 18  '$ Temp: 98 F (36.7 C)  97.8 F (36.6 C)  98.3 F (36.8 C)  TempSrc: Oral Oral    SpO2: 100% 99%    Weight:      Height:       General: alert, cooperative and no distress Lochia: appropriate Uterine Fundus: firm Incision: Healing well with no significant drainage, No significant erythema, Dressing is clean, dry, and intact DVT Evaluation: No evidence of DVT seen on physical exam. Labs: Lab Results  Component Value Date   WBC 8.3 10/08/2017   HGB 12.0 10/08/2017   HCT 36.5 10/08/2017   MCV 86.1 10/08/2017   PLT 161 10/08/2017   CMP Latest Ref Rng & Units 10/08/2017  Glucose 65 - 99 mg/dL 109(H)  BUN 6 - 20 mg/dL 8  Creatinine 0.44 - 1.00 mg/dL 0.62  Sodium 135 - 145 mmol/L 135  Potassium 3.5 - 5.1 mmol/L 4.1  Chloride 101 - 111 mmol/L 107  CO2 22 - 32 mmol/L 18(L)  Calcium 8.9 - 10.3 mg/dL  9.0  Total Protein 6.5 - 8.1 g/dL 6.4(L)  Total Bilirubin 0.3 - 1.2 mg/dL 0.5  Alkaline Phos 38 - 126 U/L 220(H)  AST 15 - 41 U/L 29  ALT 14 - 54 U/L 24    Discharge instruction: per After Visit Summary and "Baby and Me Booklet".  After visit meds:  Allergies as of 10/10/2017   No Known Allergies     Medication List    STOP taking these medications   metFORMIN 500 MG tablet Commonly known as:  GLUCOPHAGE     TAKE these medications   ACCU-CHEK FASTCLIX LANCETS Misc 1 Device by Percutaneous route 4 (four) times daily.   acetaminophen 325 MG tablet Commonly known as:  TYLENOL Take 2 tablets (650 mg total) by mouth every 4 (four) hours as needed for mild pain.   albuterol 108 (90 Base) MCG/ACT inhaler Commonly known as:  PROVENTIL HFA;VENTOLIN HFA Inhale 2 puffs into the lungs every 6 (six) hours as needed for wheezing or shortness of breath.   amLODipine 5 MG tablet Commonly known as:  NORVASC Take 1 tablet (5 mg total) by mouth daily.   glucose blood test strip Commonly known as:  ACCU-CHEK GUIDE Use as instructed QID   ibuprofen 600 MG tablet Commonly known as:  ADVIL,MOTRIN Take 1  tablet (600 mg total) by mouth every 6 (six) hours.   oxyCODONE 5 MG immediate release tablet Commonly known as:  Oxy IR/ROXICODONE Take 1 tablet (5 mg total) by mouth every 4 (four) hours as needed for severe pain.   pantoprazole 20 MG tablet Commonly known as:  PROTONIX Take 1 tablet (20 mg total) by mouth daily. What changed:    when to take this  reasons to take this   pneumococcal 23 valent vaccine 25 MCG/0.5ML injection Commonly known as:  PNU-IMMUNE Inject 0.5 mLs into the muscle tomorrow at 10 am for 1 dose.   PREPLUS 27-1 MG Tabs Take 1 tablet by mouth daily. What changed:  when to take this   Tdap 5-2.5-18.5 LF-MCG/0.5 injection Commonly known as:  BOOSTRIX Inject 0.5 mLs into the muscle once for 1 dose.       Diet: routine diet  Activity: Advance as tolerated. Pelvic rest for 6 weeks  Outpatient follow up:1 week BP check, 2 week incision check Follow up Appt: Future Appointments  Date Time Provider [place]  10/23/2017  9:15 AM [place]  10/24/2017  8:20 AM [facility]   Follow up Visit:No follow-ups on file.  Postpartum contraception: Tubal Ligation  Newborn Data: Live born female  Birth Weight: 7 lb 1.6 oz (3221 g) APGAR: 23, 9  Newborn Delivery   Birth date/time:  10/08/2017 21:17:00 Delivery type:  Vaginal, Spontaneous     Baby Feeding: Breast Disposition:home with mother   10/10/2017 Bonnita Hollow, MD   I have spoken with and examined this patient and agree with resident/PA-S/Med-S/SNM's note and plan of care. VSS, HRR&R, Resp unlabored, Legs neg.  Nigel Berthold, CNM 10/14/2017 9:18 AM

## 2017-10-11 ENCOUNTER — Ambulatory Visit (HOSPITAL_COMMUNITY)
Admission: EM | Admit: 2017-10-11 | Discharge: 2017-10-11 | Discharge status: Against Medical Advice | Payer: Medicaid Other

## 2017-10-15 ENCOUNTER — Ambulatory Visit (INDEPENDENT_AMBULATORY_CARE_PROVIDER_SITE_OTHER): Payer: Medicaid Other | Admitting: *Deleted

## 2017-10-15 VITALS — BP 147/89 | HR 83 | Ht 61.0 in | Wt 216.1 lb

## 2017-10-15 DIAGNOSIS — Z0130 Encounter for examination of blood pressure without abnormal findings: Secondary | ICD-10-CM

## 2017-10-15 NOTE — Progress Notes (Addendum)
Pt states she is having pain @ her belly button from BTS surgery performed 10/09/17. She is taking ibuprofen and tylenol. She took last Oxycodone tablet yesterday. Honeycomb dressing still in place - removed. Incision found to be well-healed with no redness, swelling, bleeding or drainage. Care of incision reviewed and pt voiced understanding. Pt has appt in 1 week for incision check. She denies H/A or visual disturbances.   Attestation of Attending Supervision of RN: Evaluation and management procedures were performed by the nurse under my supervision and collaboration.  I have reviewed the nursing note and chart, and I agree with the management and plan.  Carolyn L. Harraway-Smith, M.D., [facility] CoreFACOG

## 2017-10-23 ENCOUNTER — Other Ambulatory Visit: Payer: Medicaid Other

## 2017-10-23 ENCOUNTER — Ambulatory Visit: Payer: Medicaid Other

## 2017-10-24 ENCOUNTER — Other Ambulatory Visit: Payer: Medicaid Other

## 2017-10-24 ENCOUNTER — Ambulatory Visit (INDEPENDENT_AMBULATORY_CARE_PROVIDER_SITE_OTHER): Payer: Medicaid Other

## 2017-10-24 ENCOUNTER — Other Ambulatory Visit: Payer: Self-pay

## 2017-10-24 VITALS — BP 148/100 | HR 77 | Temp 98.5°F | Wt 212.0 lb

## 2017-10-24 DIAGNOSIS — Z5189 Encounter for other specified aftercare: Secondary | ICD-10-CM

## 2017-10-24 NOTE — Patient Instructions (Signed)
Postpartum Tubal Ligation, Care After Refer to this sheet in the next few weeks. These instructions provide you with information about caring for yourself after your procedure. Your health care provider may also give you more specific instructions. Your treatment has been planned according to current medical practices, but problems sometimes occur. Call your health care provider if you have any problems or questions after your procedure. What can I expect after the procedure? After the procedure, it is common to have:  A sore throat.  Bruising or pain in your back.  Nausea or vomiting.  Dizziness.  Mild abdominal discomfort or pain, such as cramping, gas pain, or feeling bloated.  Soreness where the incision was made.  Tiredness.  Pain in your shoulders.  Follow these instructions at home: Medicines  Take over-the-counter and prescription medicines only as told by your health care provider.  Do not take aspirin because it can cause bleeding.  Do not drive or operate heavy machinery while taking prescription pain medicine. Activity  Rest for the rest of the day.  Gradually return to your normal activities over the next few days.  Do not have sex, douche, or put a tampon or anything else in your vagina for 6 weeks or as long as told by your health care provider.  Do not lift anything that is heavier than your baby for 2 weeks or as long as told by your health care provider. Incision care  Follow instructions from your health care provider about how to take care of your incision. Make sure you: ? Wash your hands with soap and water before you change your bandage (dressing). If soap and water are not available, use hand sanitizer. ? Change your dressing as told by your health care provider. ? Leave stitches (sutures) in place. They may need to stay in place for 2 weeks or longer.  Check your incision area every day for signs of infection. Check for: ? More redness, swelling,  or pain. ? More fluid or blood. ? Warmth. ? Pus or a bad smell. Other Instructions  Do not take baths, swim, or use a hot tub until your health care provider approves. You may take showers.  Keep all follow-up visits as told by your health care provider. This is important. Contact a health care provider if:  You have more redness, swelling, or pain around your incision.  Your incision feels warm to the touch.  You have pus or a bad smell coming from your incision.  The edges of your incision break open after the sutures have been removed.  Your pain does not improve after 2-3 days.  You have a rash.  You repeatedly become dizzy or lightheaded.  Your pain medicine is not helping.  You are constipated. Get help right away if:  You have a fever.  You faint.  You have pain in your abdomen that gets worse.  You have fluid or blood coming from your sutures.  You have shortness of breath or difficulty breathing.  You have chest pain or leg pain.  You have ongoing nausea or diarrhea. This information is not intended to replace advice given to you by your health care provider. Make sure you discuss any questions you have with your health care provider. Document Released: 10/29/2011 Document Revised: 10/02/2015 Document Reviewed: 04/09/2015 Elsevier Interactive Patient Education  2018 [employer] Inc.  

## 2017-10-24 NOTE — Progress Notes (Signed)
Patient here today for incision check.  Patient had tubal 10/09/2017.  Incision looks fine. Education given on care after procedure.  Patient blood pressure is elevated today and she complains of a headache.  Per Nolene Bernheimerri Burleson patient should go to [facility] for evaluation.  Patient declines and says she will go see her PCP.  Advised patient of risks and she still refused additional care.

## 2017-11-14 ENCOUNTER — Other Ambulatory Visit: Payer: Self-pay | Admitting: General Practice

## 2017-11-14 DIAGNOSIS — O24415 Gestational diabetes mellitus in pregnancy, controlled by oral hypoglycemic drugs: Secondary | ICD-10-CM

## 2017-11-17 ENCOUNTER — Other Ambulatory Visit: Payer: Medicaid Other

## 2017-11-20 ENCOUNTER — Ambulatory Visit: Payer: Medicaid Other | Admitting: Obstetrics and Gynecology

## 2017-11-28 ENCOUNTER — Encounter (HOSPITAL_COMMUNITY): Payer: Self-pay | Admitting: Emergency Medicine

## 2017-11-28 ENCOUNTER — Ambulatory Visit (HOSPITAL_COMMUNITY)
Admission: EM | Admit: 2017-11-28 | Discharge: 2017-11-28 | Disposition: A | Discharge status: Home / Self Care | Payer: Medicaid Other | Attending: Internal Medicine | Admitting: Internal Medicine

## 2017-11-28 DIAGNOSIS — M25562 Pain in left knee: Secondary | ICD-10-CM

## 2017-11-28 MED ORDER — MELOXICAM 7.5 MG PO TABS: 7.5000 mg | ORAL_TABLET | Freq: Every day | ORAL | 0 refills | Status: DC

## 2017-11-28 NOTE — ED Provider Notes (Signed)
[facility]   [id number] 11/28/17 Arrival Time: 1429  SUBJECTIVE: History from: patient. Kelsey Hunter is a 42 y.o. female complains of worsening left knee pain that began 5 days ago.  Denies a precipitating event or specific injury.  Localizes the pain to behind knee.  Describes the pain as intermittent sharp, and achy in character.  Has tried OTC medications including motrin and aleve without relief.  Symptoms are made worse with walking, but is able to bear-weight.  Denies similar symptoms in the past.  Complains instability.  Denies fever, chills, erythema, ecchymosis, effusion, weakness, numbness and tingling.      ROS: As per HPI.  Past Medical History:  Diagnosis Date  . Asthma    inhaler when needed  . Hypertension    Past Surgical History:  Procedure Laterality Date  . CHOLECYSTECTOMY  03/2000  . TUBAL LIGATION Bilateral 10/09/2017   Procedure: POST PARTUM TUBAL LIGATION;  Surgeon: [place] Bing, MD;  Location: [facility] BIRTHING SUITES;  Service: Gynecology;  Laterality: Bilateral;   No Known Allergies No current facility-administered medications on file prior to encounter.    Current Outpatient Medications on File Prior to Encounter  Medication Sig Dispense Refill  . ACCU-CHEK FASTCLIX LANCETS MISC 1 Device by Percutaneous route 4 (four) times daily. 100 each 12  . acetaminophen (TYLENOL) 325 MG tablet Take 2 tablets (650 mg total) by mouth every 4 (four) hours as needed for mild pain. 30 tablet 0  . amLODipine (NORVASC) 5 MG tablet Take 1 tablet (5 mg total) by mouth daily. 30 tablet 11  . glucose blood (ACCU-CHEK GUIDE) test strip Use as instructed QID 100 each 12  . ibuprofen (ADVIL,MOTRIN) 600 MG tablet Take 1 tablet (600 mg total) by mouth every 6 (six) hours. 30 tablet 0  . oxyCODONE (OXY IR/ROXICODONE) 5 MG immediate release tablet Take 1 tablet (5 mg total) by mouth every 4 (four) hours as needed for severe pain. 30 tablet 0  . Prenatal Vit-Fe Fumarate-FA  (PREPLUS) 27-1 MG TABS Take 1 tablet by mouth daily. (Patient taking differently: Take 1 tablet by mouth at bedtime. ) 30 tablet 13   Social History   Socioeconomic History  . Marital status: Single    Spouse name: Not on file  . Number of children: Not on file  . Years of education: Not on file  . Highest education level: Not on file  Occupational History  . Not on file  Social Needs  . Financial resource strain: Not on file  . Food insecurity:    Worry: Not on file    Inability: Not on file  . Transportation needs:    Medical: Not on file    Non-medical: Not on file  Tobacco Use  . Smoking status: Never Smoker  . Smokeless tobacco: Never Used  Substance and Sexual Activity  . Alcohol use: No  . Drug use: No  . Sexual activity: Yes    Birth control/protection: None  Lifestyle  . Physical activity:    Days per week: Not on file    Minutes per session: Not on file  . Stress: Not on file  Relationships  . Social connections:    Talks on phone: Not on file    Gets together: Not on file    Attends religious service: Not on file    Active member of club or organization: Not on file    Attends meetings of clubs or organizations: Not on file    Relationship status: Not on  file  . Intimate partner violence:    Fear of current or ex partner: Not on file    Emotionally abused: Not on file    Physically abused: Not on file    Forced sexual activity: Not on file  Other Topics Concern  . Not on file  Social History Narrative  . Not on file   Family History  Problem Relation Age of Onset  . Kidney disease Father   . Heart disease Father     OBJECTIVE:  Vitals:   11/28/17 1456  BP: (!) 124/94  Pulse: 87  Resp: 18  Temp: 98 F (36.7 C)  TempSrc: Oral  SpO2: 99%    General appearance: AOx3; in no acute distress.  Head: NCAT Lungs: CTA bilaterally Heart: RRR.  Clear S1 and S2 without murmur, gallops, or rubs.  Radial pulses 2+ bilaterally. Musculoskeletal: Right  knee Inspection: Skin warm, dry, clear and intact without obvious erythema, effusion, or ecchymosis.  Palpation: Tender about the posterior aspect, no cyst appreciated; diffusely tender about the anterolateral aspect of knee ROM: FROM active and passive Strength: 5/5 hip flexion, 5/5 knee abduction, 5/5 knee adduction, 5/5 knee flexion, 5/5 knee extension, 5/5 dorsiflexion, 5/5 plantar flexion Stability: Anterior/ posterior drawer intact Negative homan's Bilateral ankle circumference 23 cm RT calf circumference 51 cm vs. LT calf circumference of 50 cm Skin: warm and dry Neurologic: Antalgic gait; Sensation intact about the upper/ lower extremities Psychological: alert and cooperative; normal mood and affect  ASSESSMENT & PLAN:  1. Posterior left knee pain     Meds ordered this encounter  Medications  . meloxicam (MOBIC) 7.5 MG tablet    Sig: Take 1 tablet (7.5 mg total) by mouth daily.    Dispense:  20 tablet    Refill:  0    Order Specific Question:   Supervising Provider    Answer:   Isa RankinMURRAY, LAURA WILSON [[id number]]   Knee brace given Continue conservative management of rest, ice, and gentle stretches Take mobic as needed for pain relief (may cause abdominal discomfort, ulcers, and GI bleeds avoid taking with other NSAIDs) Follow up with PCP if symptoms persist Return or go to the ER if you have any new or worsening symptoms (fever, chills, chest pain, abdominal pain, changes in bowel or bladder habits, pain radiating into lower legs, etc...)   Reviewed expectations re: course of current medical issues. Questions answered. Outlined signs and symptoms indicating need for more acute intervention. Patient verbalized understanding. After Visit Summary given.    Rennis HardingWurst, Shyann Hefner, PA-C 11/28/17 1556

## 2017-11-28 NOTE — ED Triage Notes (Signed)
Pt sts upper left leg pain; pt denies obvious injury

## 2017-11-28 NOTE — Discharge Instructions (Signed)
Knee brace given Continue conservative management of rest, ice, and gentle stretches Take mobic as needed for pain relief (may cause abdominal discomfort, ulcers, and GI bleeds avoid taking with other NSAIDs) Follow up with PCP if symptoms persist Return or go to the ER if you have any new or worsening symptoms (fever, chills, chest pain, abdominal pain, changes in bowel or bladder habits, pain radiating into lower legs, etc...)

## 2018-01-28 ENCOUNTER — Ambulatory Visit (HOSPITAL_COMMUNITY)
Admission: EM | Admit: 2018-01-28 | Discharge: 2018-01-28 | Disposition: A | Discharge status: Home / Self Care | Payer: Medicaid Other | Attending: Family Medicine | Admitting: Family Medicine

## 2018-01-28 ENCOUNTER — Encounter (HOSPITAL_COMMUNITY): Payer: Self-pay

## 2018-01-28 DIAGNOSIS — J069 Acute upper respiratory infection, unspecified: Secondary | ICD-10-CM

## 2018-01-28 DIAGNOSIS — B9789 Other viral agents as the cause of diseases classified elsewhere: Secondary | ICD-10-CM

## 2018-01-28 MED ORDER — PREDNISONE 50 MG PO TABS: 50.0000 mg | ORAL_TABLET | Freq: Every day | ORAL | 0 refills | Status: DC

## 2018-01-28 MED ORDER — IPRATROPIUM BROMIDE 0.06 % NA SOLN: 2.0000 | Freq: Four times a day (QID) | NASAL | 0 refills | Status: DC

## 2018-01-28 MED ORDER — FLUTICASONE PROPIONATE 50 MCG/ACT NA SUSP: 2.0000 | Freq: Every day | NASAL | 0 refills | Status: AC

## 2018-01-28 MED ORDER — BENZONATATE 100 MG PO CAPS: 100.0000 mg | ORAL_CAPSULE | Freq: Three times a day (TID) | ORAL | 0 refills | Status: DC

## 2018-01-28 MED ORDER — ONDANSETRON 4 MG PO TBDP: 4.0000 mg | ORAL_TABLET | Freq: Three times a day (TID) | ORAL | 0 refills | Status: DC | PRN

## 2018-01-28 MED ORDER — IBUPROFEN 800 MG PO TABS: 800.0000 mg | ORAL_TABLET | Freq: Three times a day (TID) | ORAL | 0 refills | Status: DC | PRN

## 2018-01-28 NOTE — ED Provider Notes (Signed)
[facility]    CSN: [id number] Arrival date & time: 01/28/18  1511     History   Chief Complaint Chief Complaint  Patient presents with  . URI    HPI Kelsey Hunter is a 42 y.o. female.   42 year old female comes in for 3-day history of URI symptoms.  Cough, congestion, drainage, frontal headache.  Denies sore throat, but with some throat irritation.  No obvious fever, chills, night sweats.  Nausea without vomiting.  Denies any abdominal pain.  Decreased food intake due to decreased appetite, but has still been drinking fluids without difficulty.  Has not taken anything for symptoms.  Never smoker.  Positive sick contact.  Not breast-feeding.     Past Medical History:  Diagnosis Date  . Asthma    inhaler when needed  . Hypertension     Patient Active Problem List   Diagnosis Date Noted  . Gestational diabetes mellitus, class A2 10/08/2017  . SVD (spontaneous vaginal delivery) 10/08/2017  . Gestational diabetes mellitus (GDM) in third trimester 08/07/2017  . Supervision of high risk pregnancy, antepartum 04/14/2017  . Chronic hypertension during pregnancy, antepartum 04/14/2017  . Advanced maternal age in multigravida 04/14/2017  . Encounter for supervision of pregnancy with grand mulitparity in second trimester, antepartum 04/14/2017  . Morbid obesity (HCC) 04/14/2017    Past Surgical History:  Procedure Laterality Date  . CHOLECYSTECTOMY  03/2000  . TUBAL LIGATION Bilateral 10/09/2017   Procedure: POST PARTUM TUBAL LIGATION;  Surgeon: Becker Bing, MD;  Location: [facility] BIRTHING SUITES;  Service: Gynecology;  Laterality: Bilateral;    OB History    Gravida  12   Para  7   Term  7   Preterm      AB  5   Living  7     SAB  5   TAB      Ectopic      Multiple  0   Live Births  7            Home Medications    Prior to Admission medications   Medication Sig Start Date End Date Taking? Authorizing Provider  ACCU-CHEK FASTCLIX  LANCETS MISC 1 Device by Percutaneous route 4 (four) times daily. 08/07/17   Hermina Staggers, MD  acetaminophen (TYLENOL) 325 MG tablet Take 2 tablets (650 mg total) by mouth every 4 (four) hours as needed for mild pain. 10/10/17   Garnette Gunner, MD  amLODipine (NORVASC) 5 MG tablet Take 1 tablet (5 mg total) by mouth daily. 10/10/17 10/10/18  Garnette Gunner, MD  benzonatate (TESSALON) 100 MG capsule Take 1 capsule (100 mg total) by mouth every 8 (eight) hours. 01/28/18   Cathie Hoops, Amy V, PA-C  fluticasone (FLONASE) 50 MCG/ACT nasal spray Place 2 sprays into both nostrils daily. 01/28/18   Cathie Hoops, Amy V, PA-C  glucose blood (ACCU-CHEK GUIDE) test strip Use as instructed QID 08/07/17   Hermina Staggers, MD  ibuprofen (ADVIL,MOTRIN) 800 MG tablet Take 1 tablet (800 mg total) by mouth every 8 (eight) hours as needed. 01/28/18   Cathie Hoops, Amy V, PA-C  ipratropium (ATROVENT) 0.06 % nasal spray Place 2 sprays into both nostrils 4 (four) times daily. 01/28/18   Cathie Hoops, Amy V, PA-C  meloxicam (MOBIC) 7.5 MG tablet Take 1 tablet (7.5 mg total) by mouth daily. 11/28/17   Wurst, [place], PA-C  ondansetron (ZOFRAN ODT) 4 MG disintegrating tablet Take 1 tablet (4 mg total) by mouth every 8 (eight) hours as needed  for nausea or vomiting. 01/28/18   Cathie HoopsYu, Amy V, PA-C  oxyCODONE (OXY IR/ROXICODONE) 5 MG immediate release tablet Take 1 tablet (5 mg total) by mouth every 4 (four) hours as needed for severe pain. 10/10/17   Garnette Gunnerhompson, Aaron B, MD  predniSONE (DELTASONE) 50 MG tablet Take 1 tablet (50 mg total) by mouth daily. 01/28/18   Cathie HoopsYu, Amy V, PA-C  Prenatal Vit-Fe Fumarate-FA (PREPLUS) 27-1 MG TABS Take 1 tablet by mouth daily. Patient taking differently: Take 1 tablet by mouth at bedtime.  06/06/17   Hermina StaggersErvin, Michael L, MD    Family History Family History  Problem Relation Age of Onset  . Kidney disease Father   . Heart disease Father     Social History Social History   Tobacco Use  . Smoking status: Never Smoker  . Smokeless  tobacco: Never Used  Substance Use Topics  . Alcohol use: No  . Drug use: No     Allergies   Patient has no known allergies.   Review of Systems Review of Systems  Reason unable to perform ROS: See HPI as above.     Physical Exam Triage Vital Signs ED Triage Vitals [01/28/18 1543]  Enc Vitals Group     BP (!) 142/101     Pulse Rate 80     Resp 20     Temp 99.4 F (37.4 C)     Temp Source Temporal     SpO2 99 %     Weight      Height      Head Circumference      Peak Flow      Pain Score      Pain Loc      Pain Edu?      Excl. in GC?    No data found.  Updated Vital Signs BP (!) 142/101 (BP Location: Right Arm)   Pulse 80   Temp 99.4 F (37.4 C) (Temporal)   Resp 20   LMP 01/11/2018   SpO2 99%   Physical Exam  Constitutional: She is oriented to person, place, and time. She appears well-developed and well-nourished. No distress.  HENT:  Head: Normocephalic and atraumatic.  Right Ear: Tympanic membrane, external ear and ear canal normal. Tympanic membrane is not erythematous and not bulging.  Left Ear: Tympanic membrane, external ear and ear canal normal. Tympanic membrane is not erythematous and not bulging.  Nose: Mucosal edema present. Right sinus exhibits maxillary sinus tenderness and frontal sinus tenderness. Left sinus exhibits maxillary sinus tenderness and frontal sinus tenderness.  Mouth/Throat: Uvula is midline, oropharynx is clear and moist and mucous membranes are normal.  Eyes: Pupils are equal, round, and reactive to light. Conjunctivae are normal.  Neck: Normal range of motion. Neck supple.  Cardiovascular: Normal rate, regular rhythm and normal heart sounds. Exam reveals no gallop and no friction rub.  No murmur heard. Pulmonary/Chest: Effort normal and breath sounds normal. She has no decreased breath sounds. She has no wheezes. She has no rhonchi. She has no rales.  Lymphadenopathy:    She has no cervical adenopathy.  Neurological: She is  alert and oriented to person, place, and time.  Skin: Skin is warm and dry.  Psychiatric: She has a normal mood and affect. Her behavior is normal. Judgment normal.     UC Treatments / Results  Labs (all labs ordered are listed, but only abnormal results are displayed) Labs Reviewed - No data to display  EKG None  Radiology No  results found.  Procedures Procedures (including critical care time)  Medications Ordered in UC Medications - No data to display  Initial Impression / Assessment and Plan / UC Course  I have reviewed the triage vital signs and the nursing notes.  Pertinent labs & imaging results that were available during my care of the patient were reviewed by me and considered in my medical decision making (see chart for details).    Discussed with patient history and exam most consistent with viral URI. Symptomatic treatment as needed. Push fluids. Return precautions given.   Final Clinical Impressions(s) / UC Diagnoses   Final diagnoses:  Viral URI with cough    ED Prescriptions    Medication Sig Dispense Auth. Provider   predniSONE (DELTASONE) 50 MG tablet Take 1 tablet (50 mg total) by mouth daily. 5 tablet Yu, Amy V, PA-C   fluticasone (FLONASE) 50 MCG/ACT nasal spray Place 2 sprays into both nostrils daily. 1 g Yu, Amy V, PA-C   ipratropium (ATROVENT) 0.06 % nasal spray Place 2 sprays into both nostrils 4 (four) times daily. 15 mL Yu, Amy V, PA-C   benzonatate (TESSALON) 100 MG capsule Take 1 capsule (100 mg total) by mouth every 8 (eight) hours. 21 capsule Yu, Amy V, PA-C   ibuprofen (ADVIL,MOTRIN) 800 MG tablet Take 1 tablet (800 mg total) by mouth every 8 (eight) hours as needed. 30 tablet Yu, Amy V, PA-C   ondansetron (ZOFRAN ODT) 4 MG disintegrating tablet Take 1 tablet (4 mg total) by mouth every 8 (eight) hours as needed for nausea or vomiting. 10 tablet Threasa Alpha, PA-C 01/28/18 1659

## 2018-01-28 NOTE — ED Triage Notes (Signed)
Pt presents with cold symptoms; nausea, general body aches, congestion, drainage fever, chills, and headache

## 2018-01-28 NOTE — Discharge Instructions (Signed)
Tessalon for cough. Prednisone for sinus pressure. Start flonase, atrovent nasal spray for nasal congestion/drainage. You can use over the counter nasal saline rinse such as neti pot for nasal congestion. Keep hydrated, your urine should be clear to pale yellow in color. Tylenol/motrin for fever and pain. Monitor for any worsening of symptoms, chest pain, shortness of breath, wheezing, swelling of the throat, follow up for reevaluation.  ° °For sore throat/cough try using a honey-based tea. Use 3 teaspoons of honey with juice squeezed from half lemon. Place shaved pieces of ginger into 1/2-1 cup of water and warm over stove top. Then mix the ingredients and repeat every 4 hours as needed. °

## 2018-04-30 ENCOUNTER — Other Ambulatory Visit (HOSPITAL_COMMUNITY): Payer: Self-pay | Admitting: Family

## 2018-04-30 DIAGNOSIS — Z1231 Encounter for screening mammogram for malignant neoplasm of breast: Secondary | ICD-10-CM

## 2018-05-06 IMAGING — US US FETAL BPP W/ NON-STRESS
1 series · 13 of 14 positions shown · non-contrast
Comparison: none

[Series 1: us fetal bpp w/nonstress · 14 acquisitions, 13 frames shown]
[im 1/14]
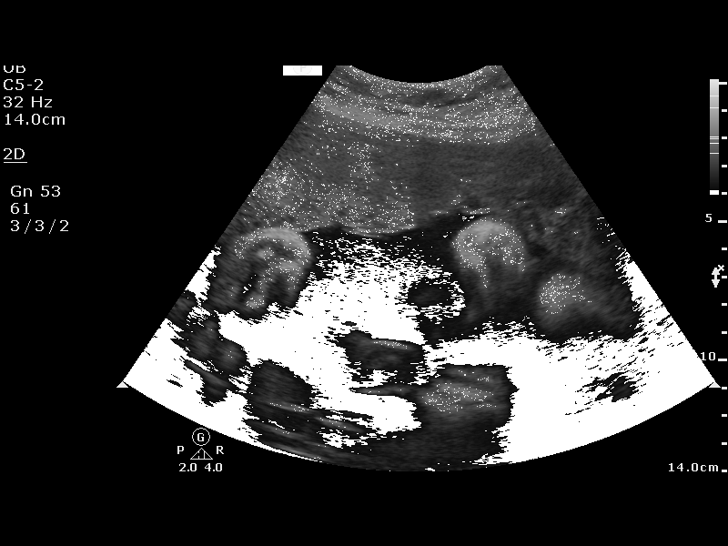
[im 2/14]
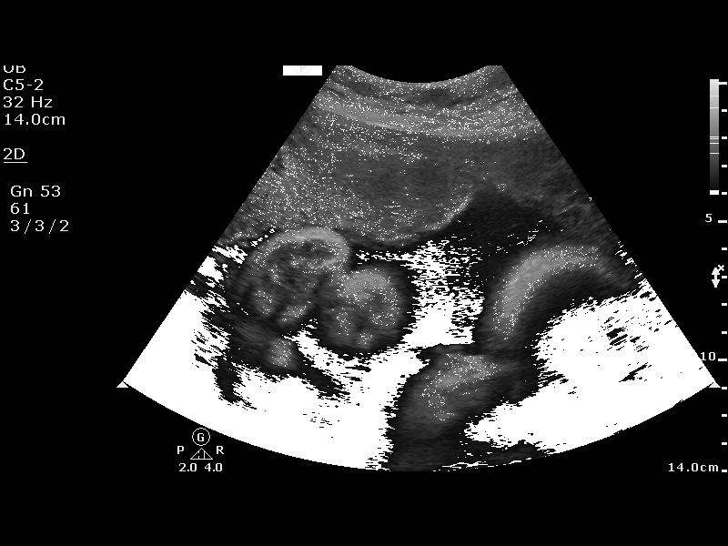
[im 3/14]
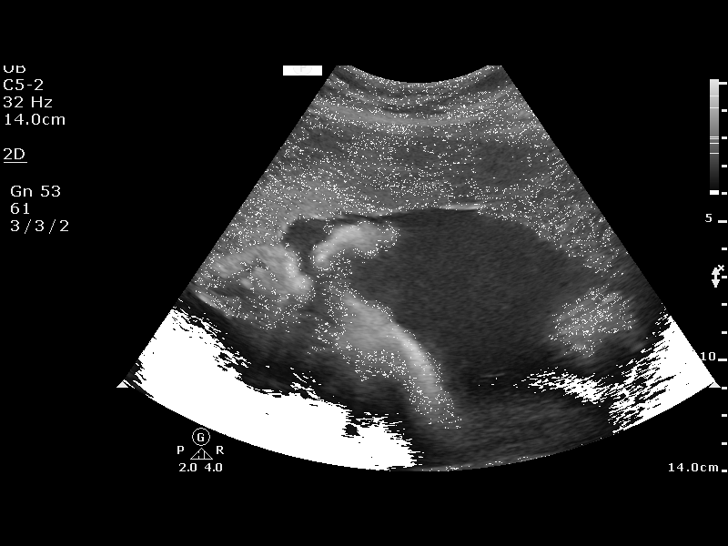
[im 4/14]
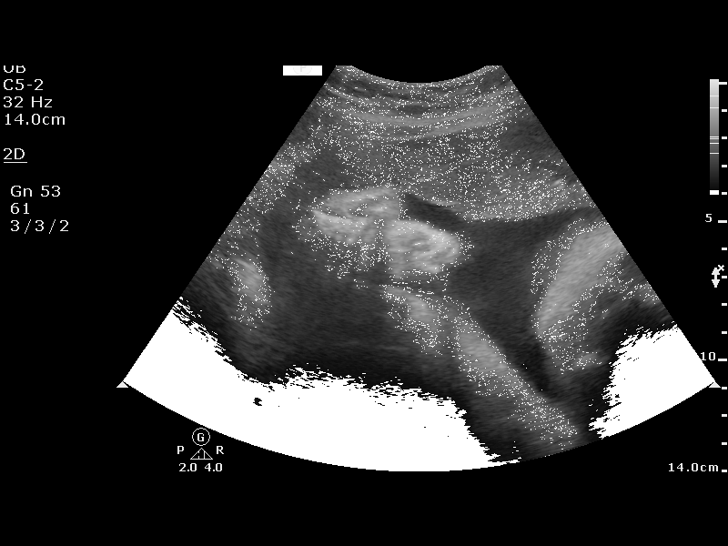
[im 5/14]
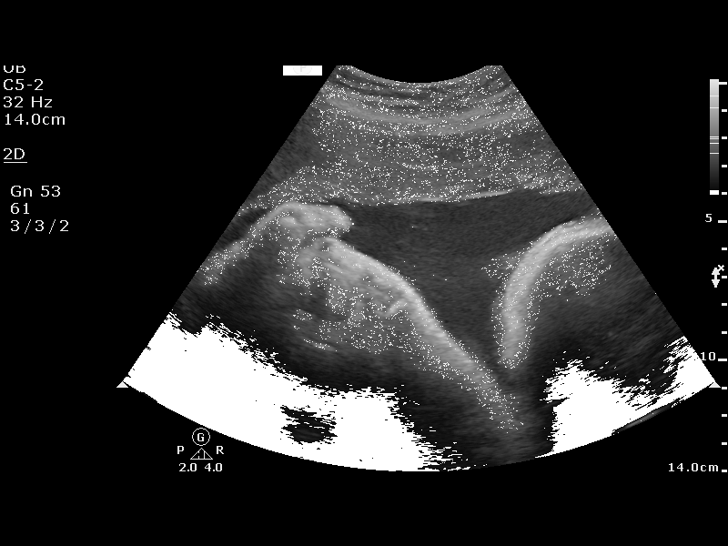
[im 6/14]
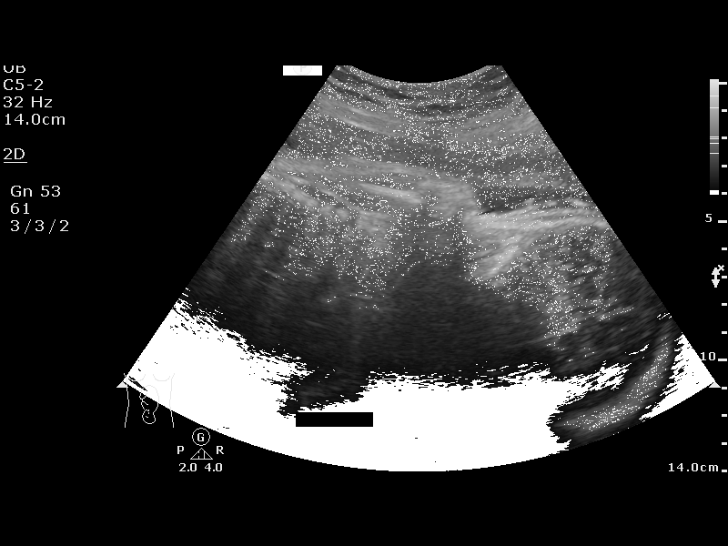
[im 8/14]
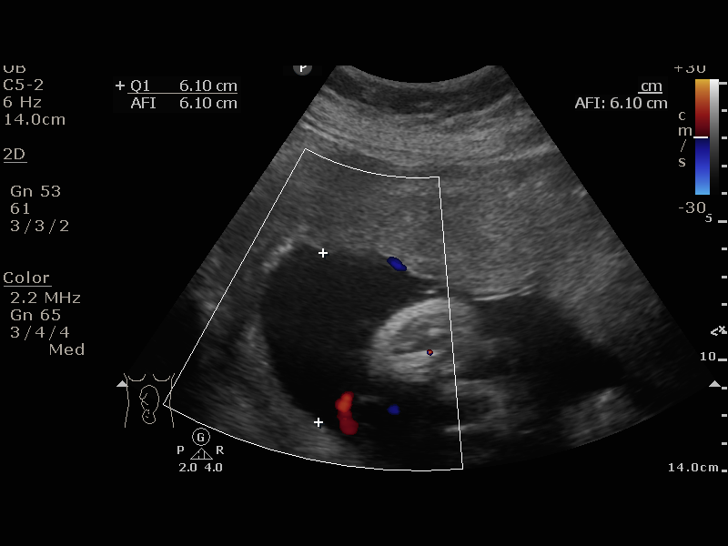
[im 9/14]
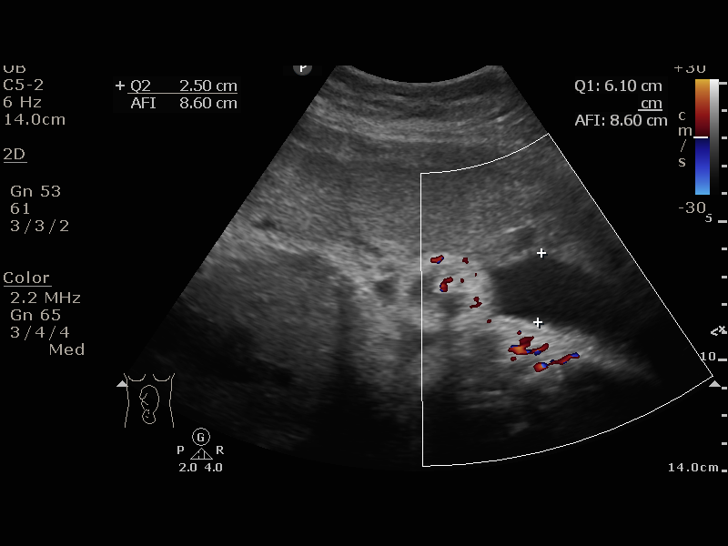
[im 10/14]
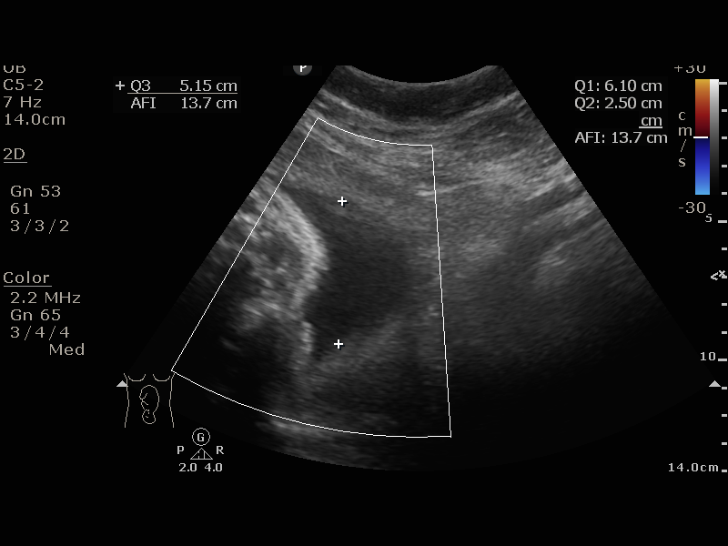
[im 11/14]
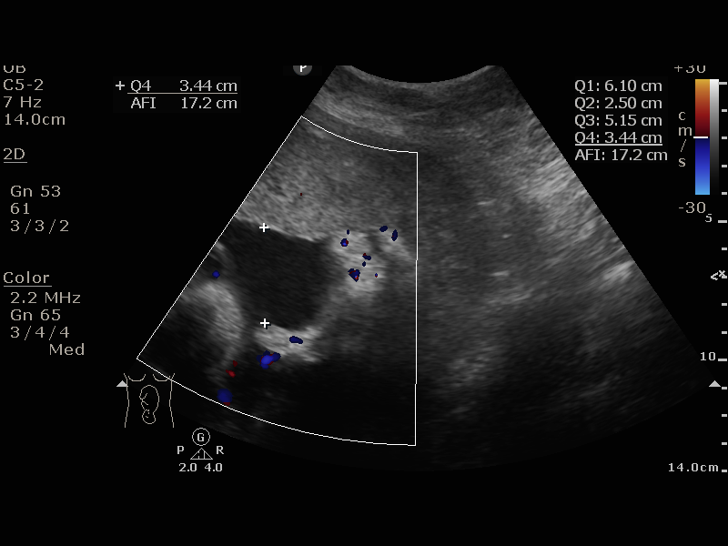
[im 12/14]
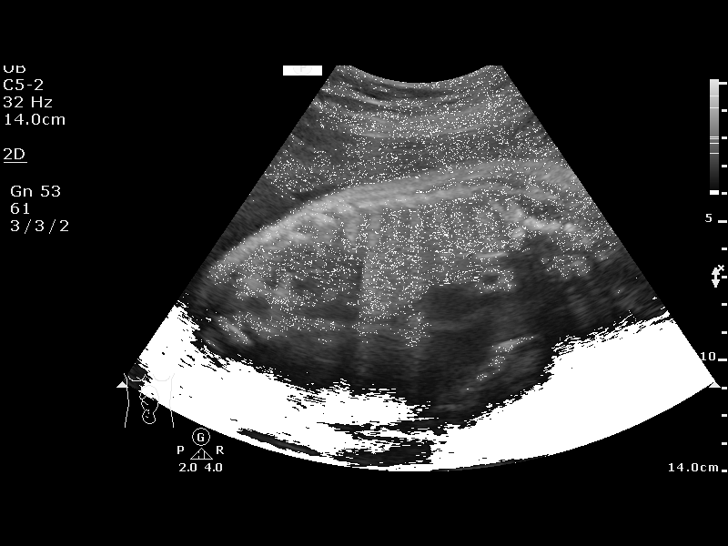
[im 13/14]
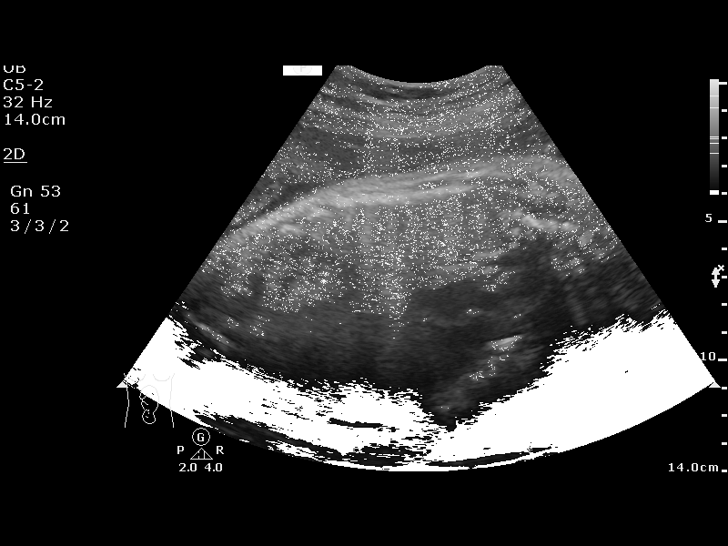
[im 14/14]
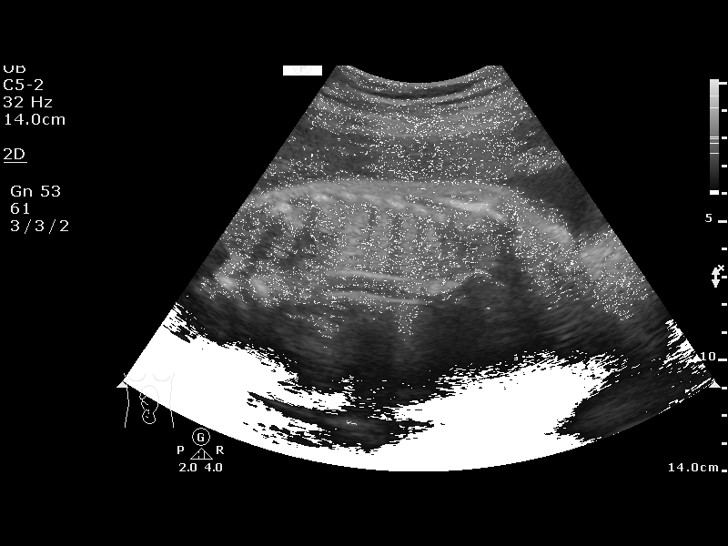

[13 of 14 positions shown; findings below may reference images not displayed]

[facility] Clinic
[facility]
[REDACTED]

1  US FETAL BPP W/NONSTRESS                    76818.4

1  SURESH SCHAAF                [id number]      [phone]     [id number]
Service(s) Provided

Indications

34 weeks gestation of pregnancy
Gestational diabetes in pregnancy,
controlled by oral hypoglycemic drugs
Unspecified pre-existing hypertension
complicating pregnancy, third trimester
Advanced maternal age multigravida 35+,
third trimester
OB History

Blood Type:            Height:  5'1"   Weight (lb):  247       BMI:
Gravidity:    12        Term:   6        Prem:   0        SAB:   5
TOP:          0       Ectopic:  0        Living: 6
Fetal Evaluation

Num Of Fetuses:     1
Preg. Location:     Intrauterine
Cardiac Activity:   Observed
Presentation:       Cephalic
Amniotic Fluid
AFI FV:      Subjectively within normal limits

AFI Sum(cm)     %Tile       Largest Pocket(cm)
17.19           63

RUQ(cm)       RLQ(cm)       LUQ(cm)        LLQ(cm)
6.1
Biophysical Evaluation

Amniotic F.V:   Pocket => 2 cm two         F. Tone:        Observed
planes
F. Movement:    Observed                   N.S.T:          Reactive
F. Breathing:   Observed                   Score:          [DATE]
Gestational Age

LMP:           34w 2d        Date:  01/08/17                 EDD:   10/15/17
Best:          34w 2d     Det. By:  LMP  (01/08/17)          EDD:   10/15/17
Impression

AMA, GDM, CHTN- reassuring testing
Recommendations

Continue testing until delivery

## 2018-05-20 IMAGING — US US MFM FETAL BPP W/O NON-STRESS
1 series · 14 of 28 positions shown · non-contrast
Comparison: none

[Series 1: us mfm fetal bpp w/o non-stress · 41 acquisitions, 14 frames shown]
[im 2/41]
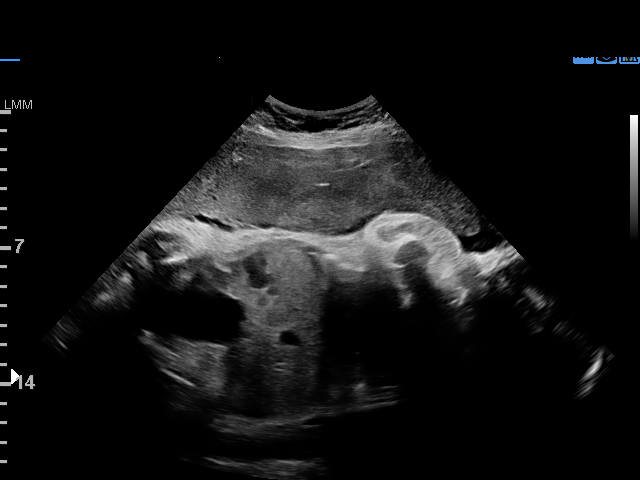
[im 5/41]
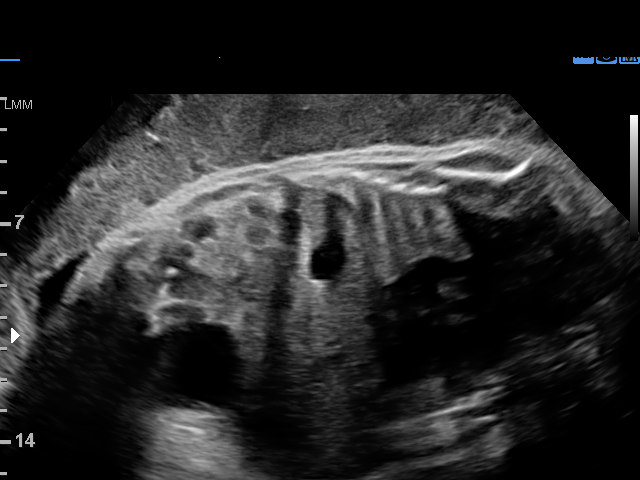
[im 8/41]
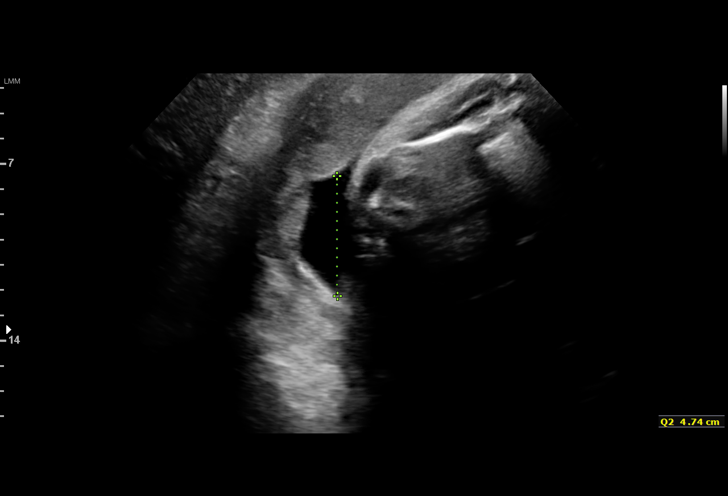
[im 11/41]
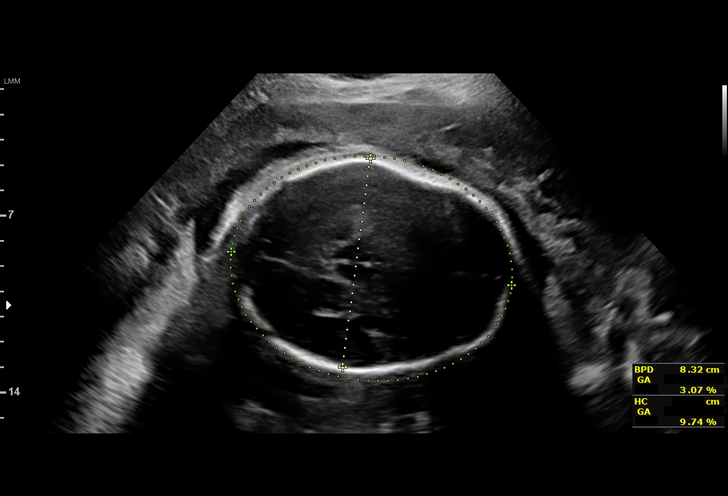
[im 14/41]
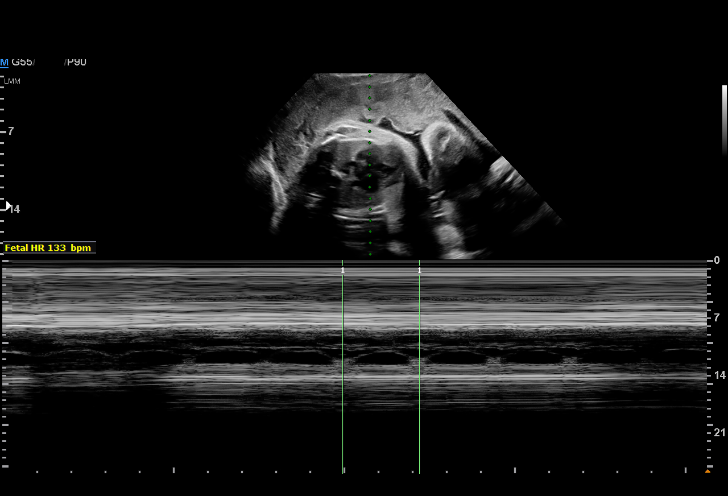
[im 17/41]
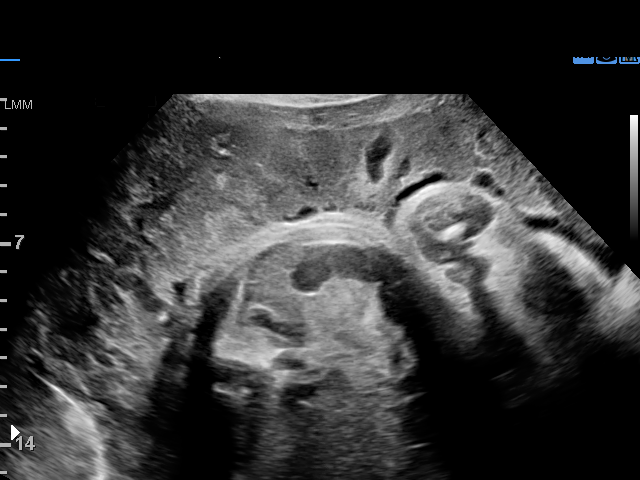
[im 20/41]
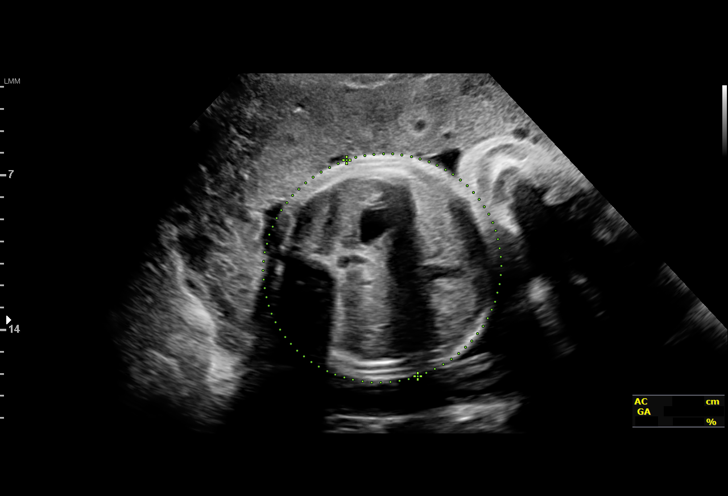
[im 23/41]
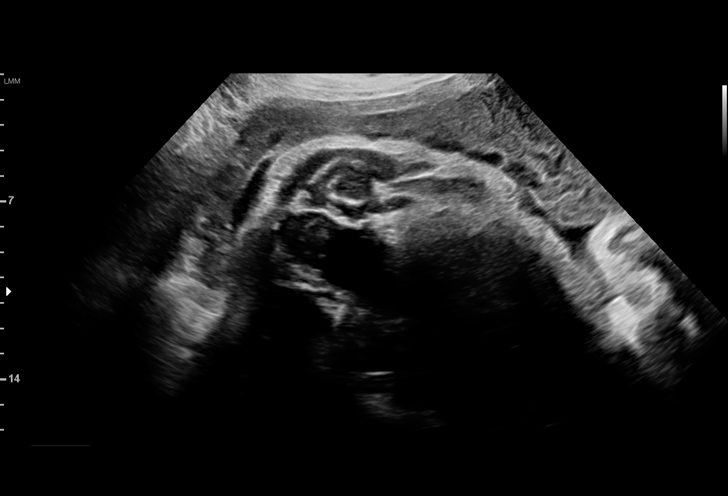
[im 26/41]
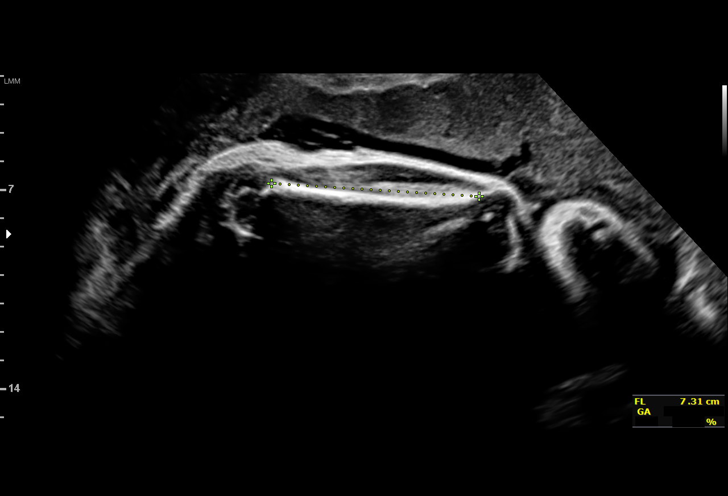
[im 29/41]
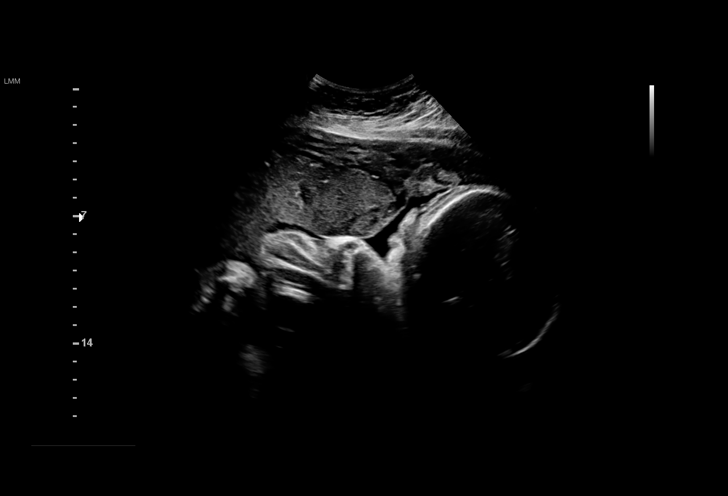
[im 32/41]
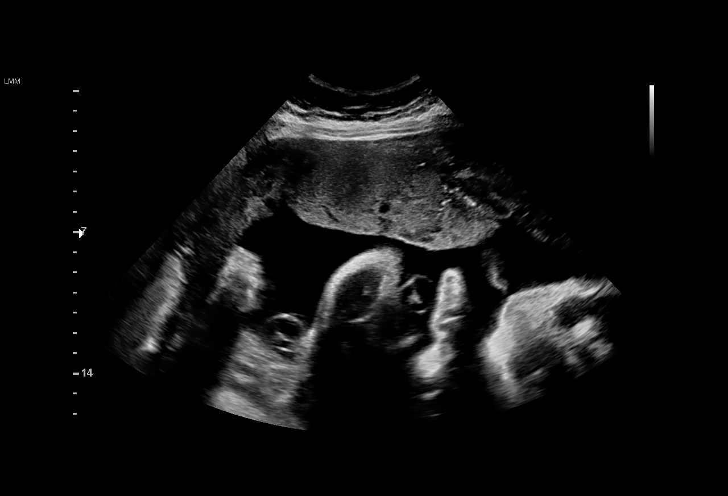
[im 35/41]
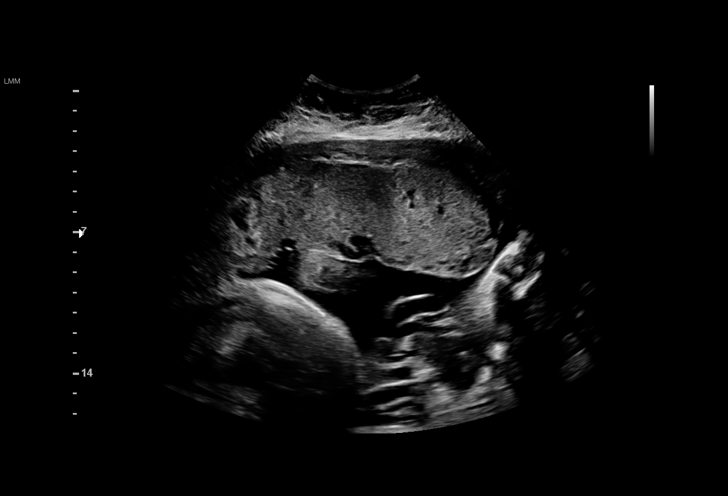
[im 38/41]
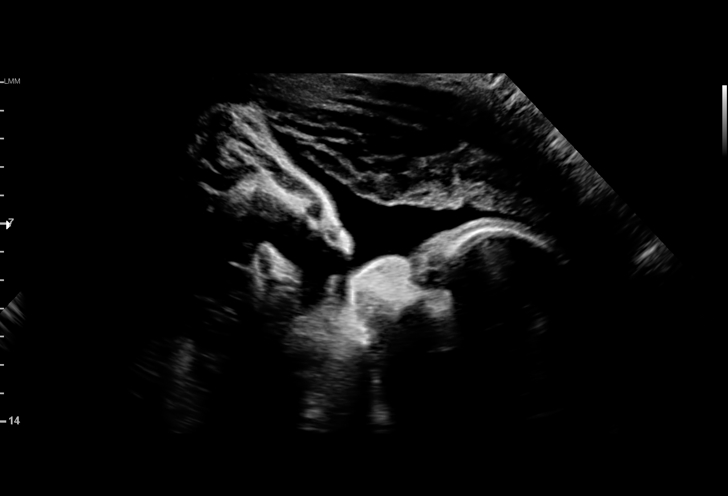
[im 41/41]
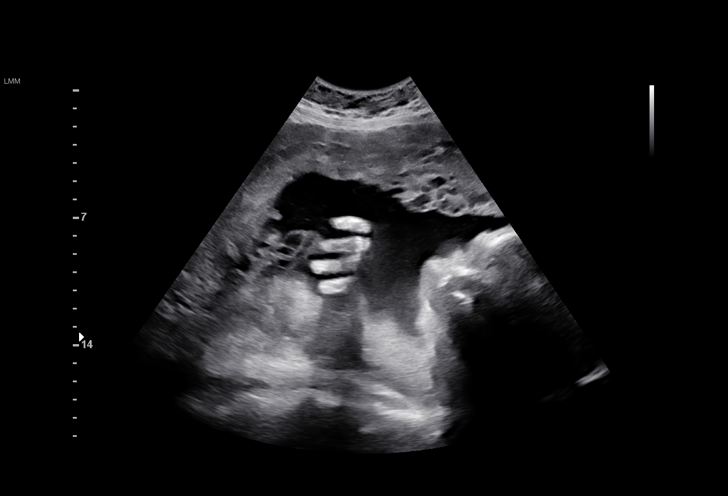

[14 of 28 positions shown; findings below may reference images not displayed]

OB/Gyn Clinic

1  SCHALKER IS                [id number]      [phone]     [id number]
2  SCHALKER IS                [id number]      [phone]     [id number]
Indications

36 weeks gestation of pregnancy
Advanced maternal age multigravida 35+,
third trimester; low risk NIPS
Unspecified pre-existing hypertension
complicating pregnancy, third trimester
Gestational diabetes in pregnancy,
controlled by oral hypoglycemic drugs
OB History

Blood Type:            Height:  5'1"   Weight (lb):  247       BMI:
Gravidity:    12        Term:   6        Prem:   0         SAB:   5
TOP:          0       Ectopic:  0        Living: 6
Fetal Evaluation

Num Of Fetuses:     1
Fetal Heart         133
Rate(bpm):
Cardiac Activity:   Observed
Presentation:       Cephalic
Placenta:           Anterior, above cervical os

Amniotic Fluid
AFI FV:      Subjectively within normal limits
AFI Sum(cm)     %Tile       Largest Pocket(cm)
17.44           65

RUQ(cm)       RLQ(cm)       LUQ(cm)        LLQ(cm)
5.33
Biophysical Evaluation

Amniotic F.V:   Pocket => 2 cm two         F. Tone:         Observed
planes
F. Movement:    Observed                   Score:           [DATE]
F. Breathing:   Observed
Biometry

BPD:      82.9  mm     G. Age:  33w 3d          2  %    CI:         69.81  %    70 - 86
FL/HC:       23.0  %    20.1 -
HC:      316.6  mm     G. Age:  35w 4d         10  %    HC/AC:       0.95       0.93 -
AC:      334.2  mm     G. Age:  37w 2d         85  %    FL/BPD:      87.7  %    71 - 87
FL:       72.7  mm     G. Age:  37w 2d         70  %    FL/AC:       21.8  %    20 - 24

Est. FW:    2999   gm     6 lb 9 oz     73  %
Gestational Age

LMP:           36w 2d        Date:  01/08/17                 EDD:    10/15/17
U/S Today:     35w 6d                                        EDD:    10/18/17
Best:          36w 2d     Det. By:  LMP  (01/08/17)          EDD:    10/15/17
Anatomy

Cranium:               Appears normal         Aortic Arch:            Previously seen
Cavum:                 Previously seen        Ductal Arch:            Previously seen
Ventricles:            Previously seen        Diaphragm:              Appears normal
Choroid Plexus:        Previously seen        Stomach:                Appears normal, left
sided
Cerebellum:            Previously seen        Abdomen:                Appears normal
Posterior Fossa:       Previously seen        Abdominal Wall:         Previously seen
Nuchal Fold:           Previously seen        Cord Vessels:           Previously seen
Face:                  Orbits and profile     Kidneys:                Appear normal
previously seen
Lips:                  Previously seen        Bladder:                Appears normal
Thoracic:              Appears normal         Spine:                  Limited views prev.
seen
Heart:                 Appears normal         Upper Extremities:      Previously seen
(4CH, axis, and situs
RVOT:                  Previously seen        Lower Extremities:      Previously seen
LVOT:                  Appears normal

Other:  Female gender. Technically difficult due to maternal habitus and fetal
position.
Impression

SIUP at 36+2 weeks
Cephalic presentation
Normal interval anatomy; anatomic survey complete
Normal amniotic fluid volume
Appropriate interval growth with EFW at the 73rd %tile
BPP [DATE]
Recommendations

Continue antenatal testing

## 2018-05-26 IMAGING — US US FETAL BPP W/ NON-STRESS
1 series · 13 of 13 positions shown · non-contrast
Comparison: none

[Series 1: us fetal bpp w/nonstress · 13 acquisitions, 13 frames shown]
[im 1/13]
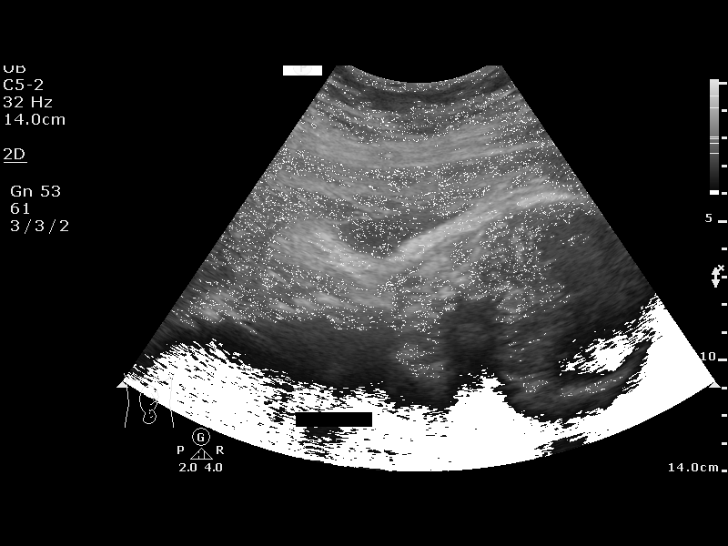
[im 2/13]
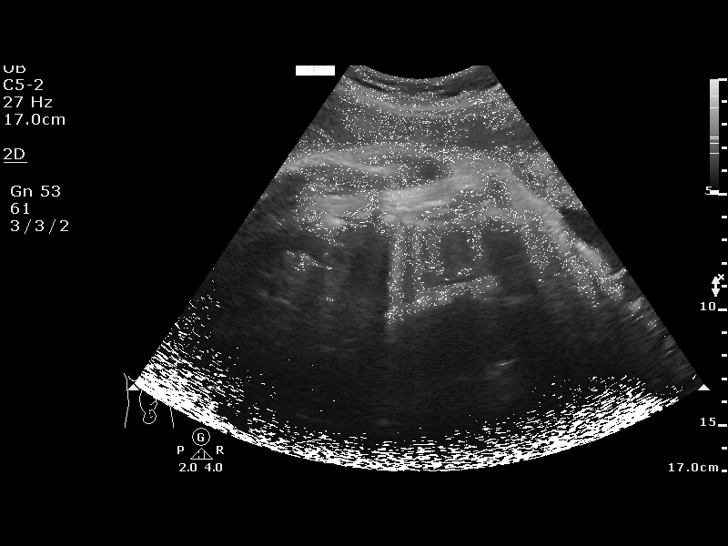
[im 3/13]
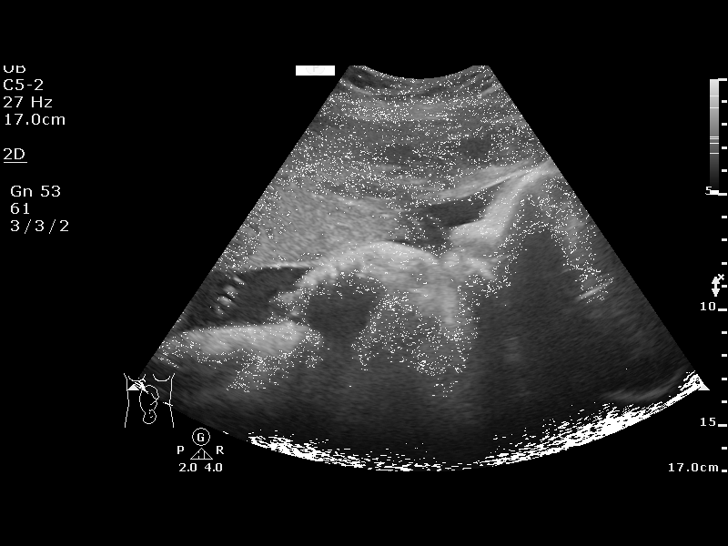
[im 4/13]
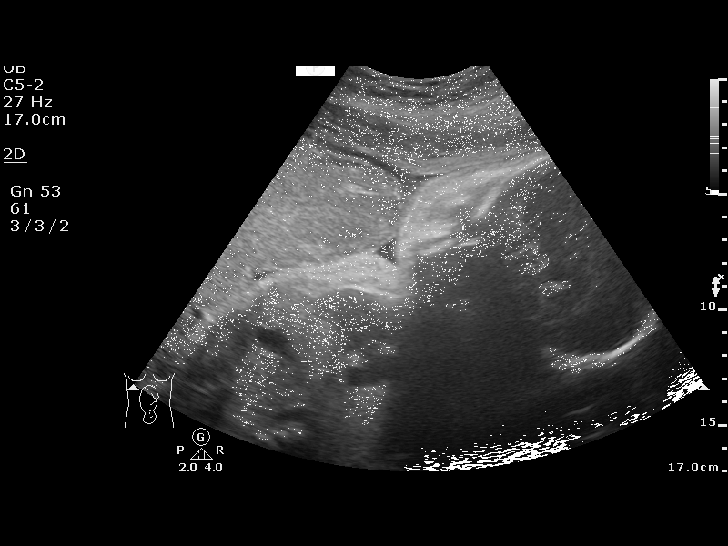
[im 5/13]
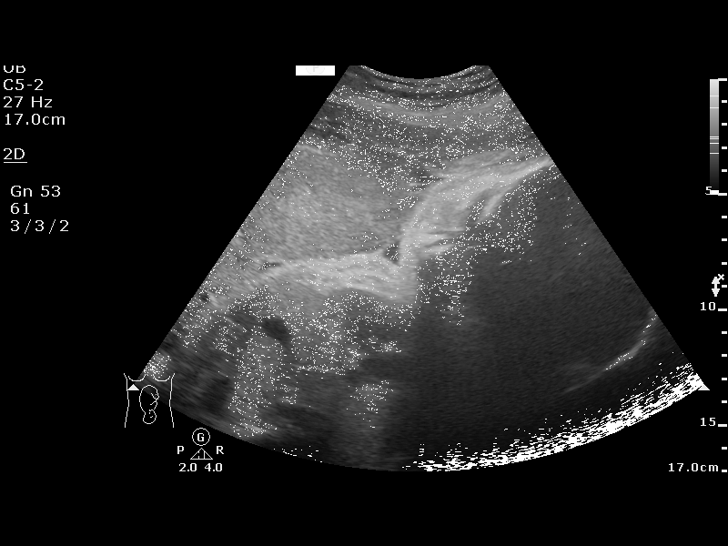
[im 6/13]
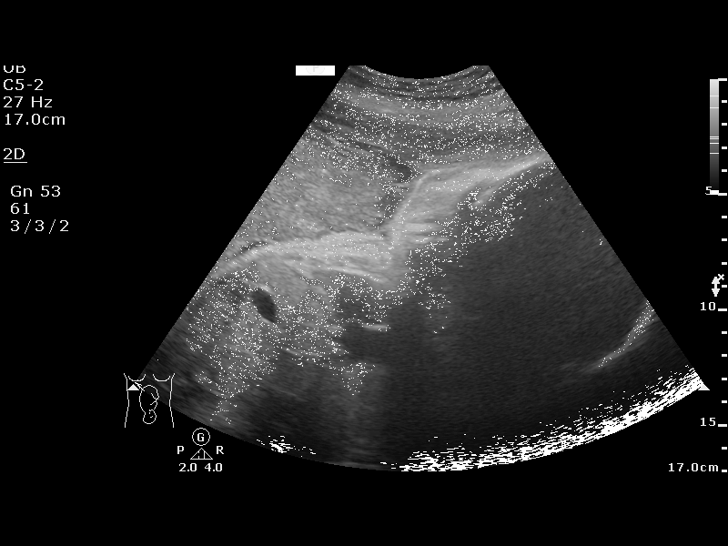
[im 7/13]
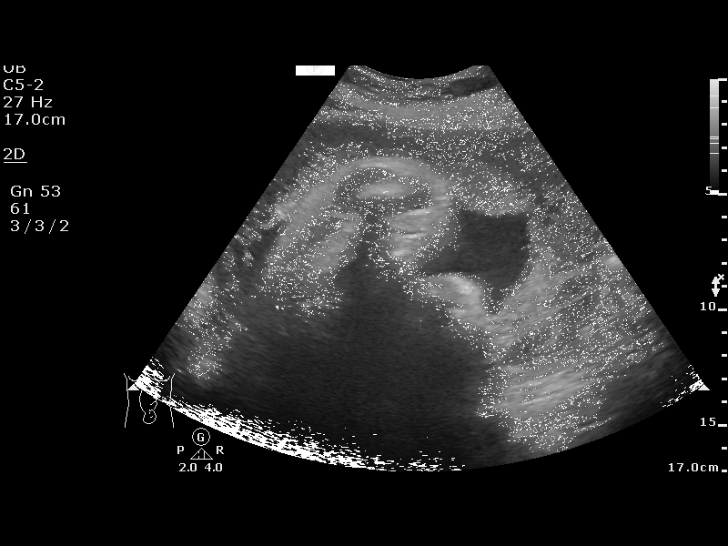
[im 8/13]
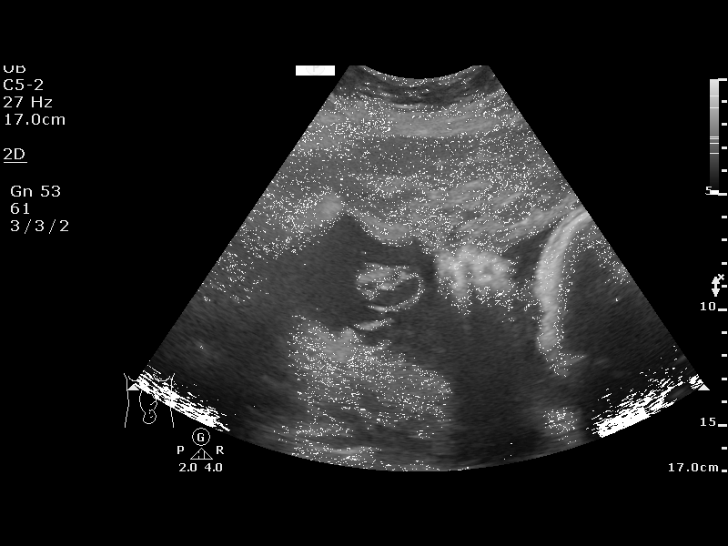
[im 9/13]
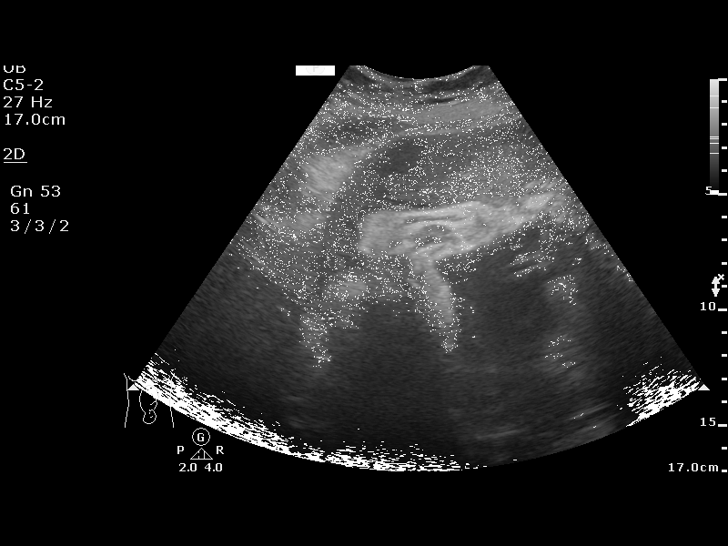
[im 10/13]
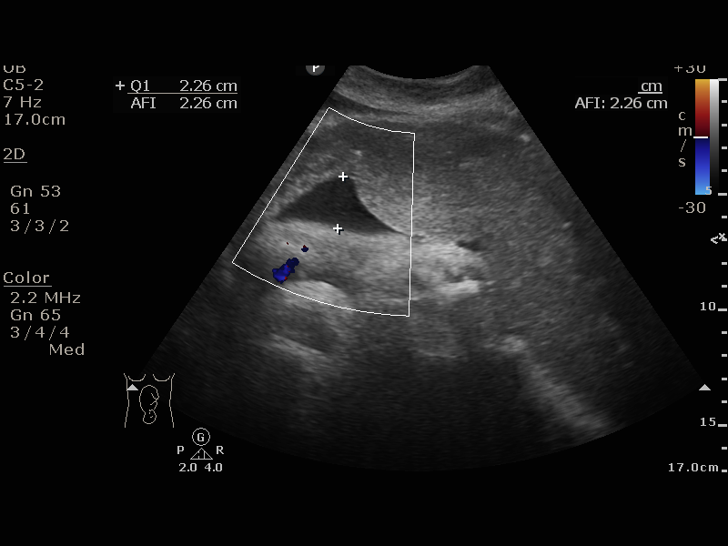
[im 11/13]
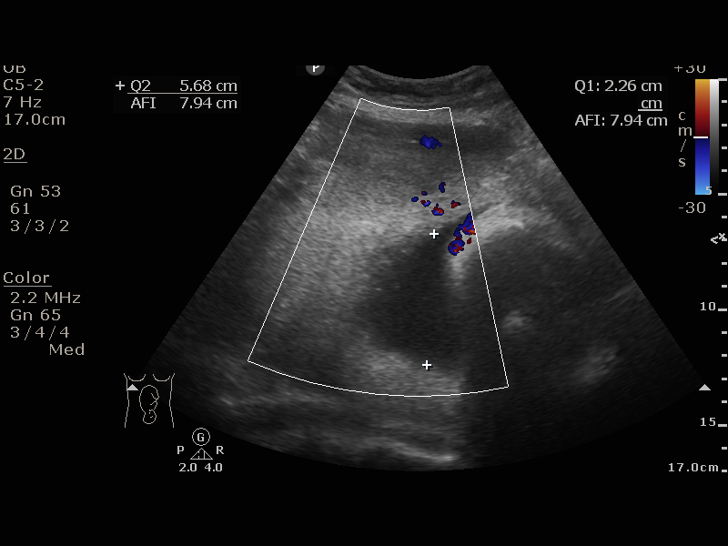
[im 12/13]
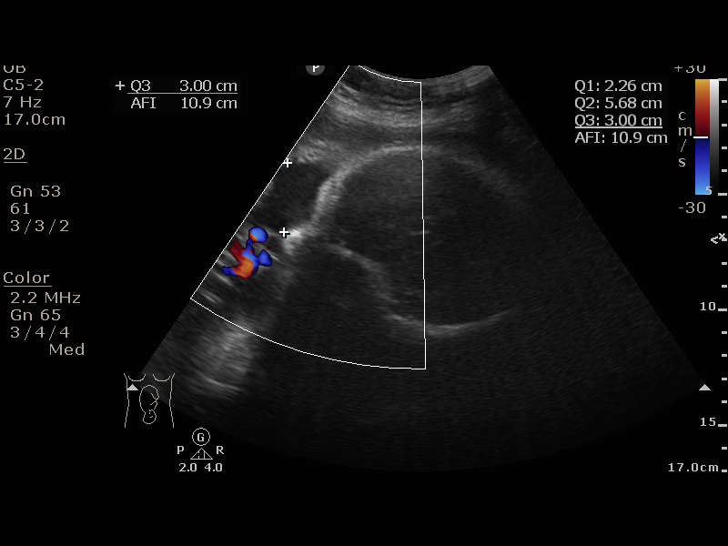
[im 13/13]
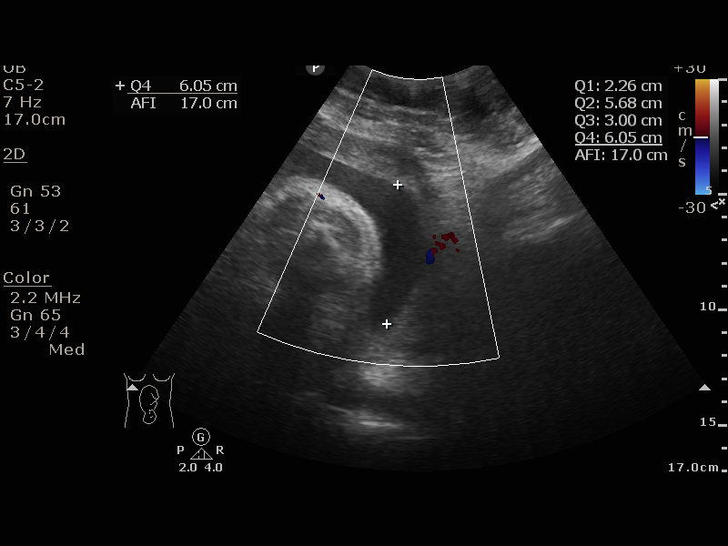

[13 of 13 positions shown; findings below may reference images not displayed]

OB[facility]Gyn Clinic
Women's
[REDACTED]

1  US FETAL BPP W/NONSTRESS                    76818.4

1  DARIJA HUMBOLT                [id number]      [id number]     [id number]
Service(s) Provided

Indications

37 weeks gestation of pregnancy
Unspecified pre-existing hypertension
complicating pregnancy, third trimester
Gestational diabetes in pregnancy,
controlled by oral hypoglycemic drugs
OB History

Blood Type:            Height:  5'1"   Weight (lb):  247       BMI:
Gravidity:    12        Term:   6        Prem:   0        SAB:   5
TOP:          0       Ectopic:  0        Living: 6
Fetal Evaluation

Num Of Fetuses:     1
Preg. Location:     Intrauterine
Cardiac Activity:   Observed
Presentation:       Cephalic

Amniotic Fluid
AFI FV:      Subjectively within normal limits
AFI Sum(cm)     %Tile       Largest Pocket(cm)
16.99           65

RUQ(cm)       RLQ(cm)       LUQ(cm)        LLQ(cm)
2.26          6.05          5.68           3
Biophysical Evaluation

Amniotic F.V:   Pocket => 2 cm two         F. Tone:        Observed
planes
F. Movement:    Observed                   N.S.T:          Reactive
F. Breathing:   Observed                   Score:          [DATE]
Gestational Age

LMP:           37w 1d        Date:  01/08/17                 EDD:   10/15/17
Best:          37w 1d     Det. By:  LMP  (01/08/17)          EDD:   10/15/17
Impression

GDM, CHTN- reassuring testing
Recommendations

Continue testing until delivery

## 2018-06-03 IMAGING — US US FETAL BPP W/ NON-STRESS
1 series · 13 of 14 positions shown · non-contrast
Comparison: none

[Series 1: us fetal bpp w/nonstress · 14 acquisitions, 13 frames shown]
[im 1/14]
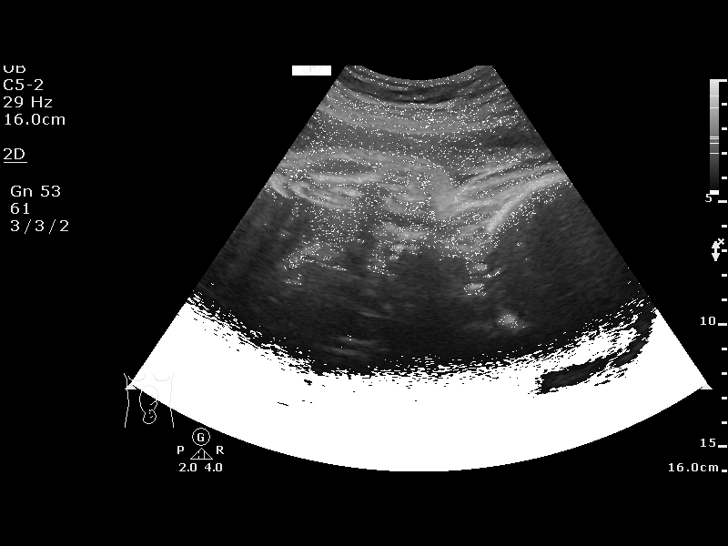
[im 2/14]
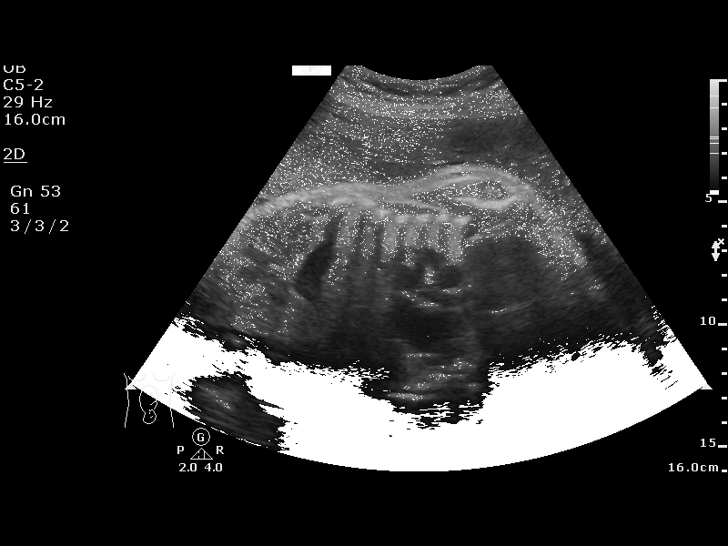
[im 3/14]
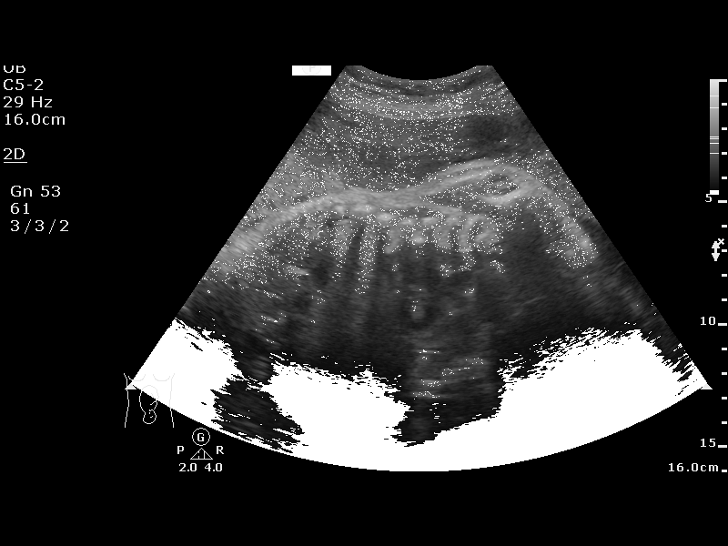
[im 4/14]
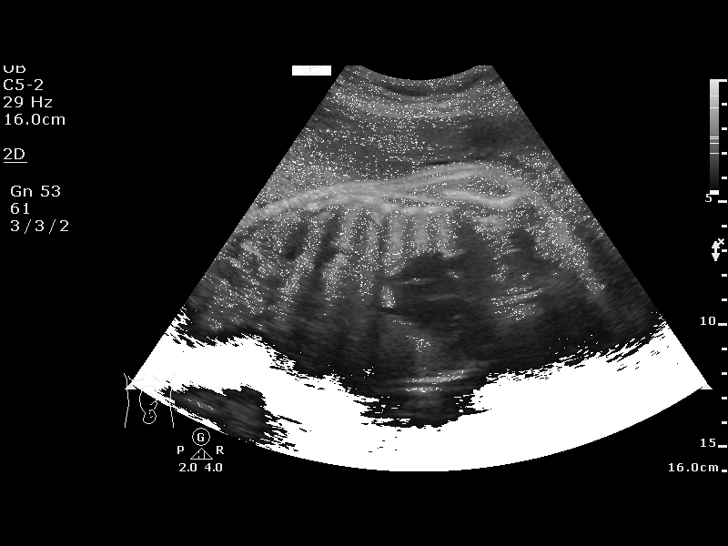
[im 5/14]
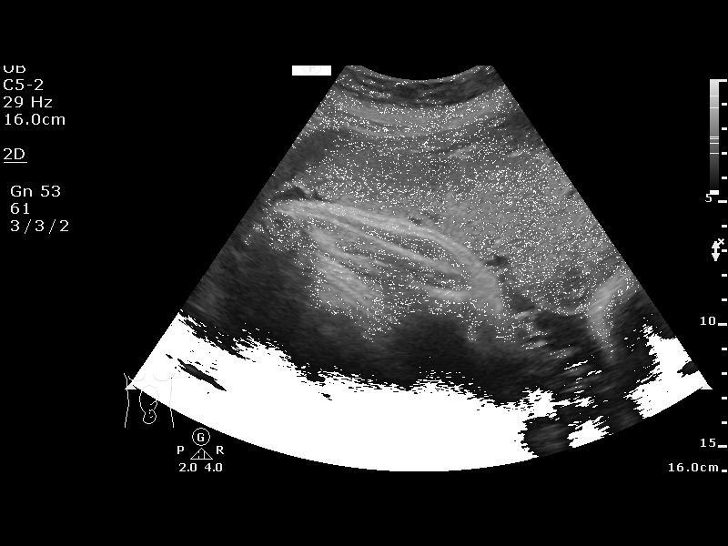
[im 6/14]
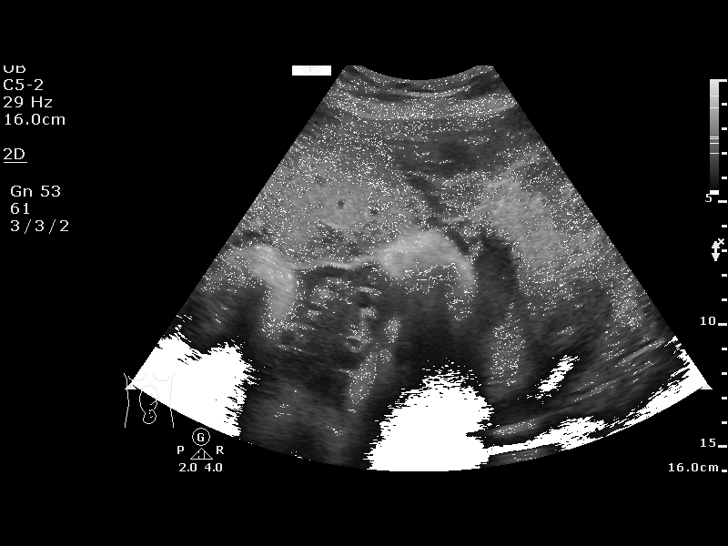
[im 8/14]
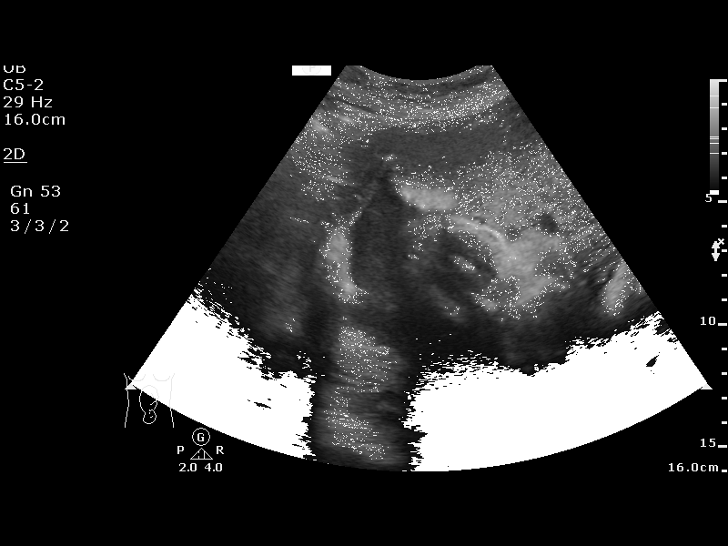
[im 9/14]
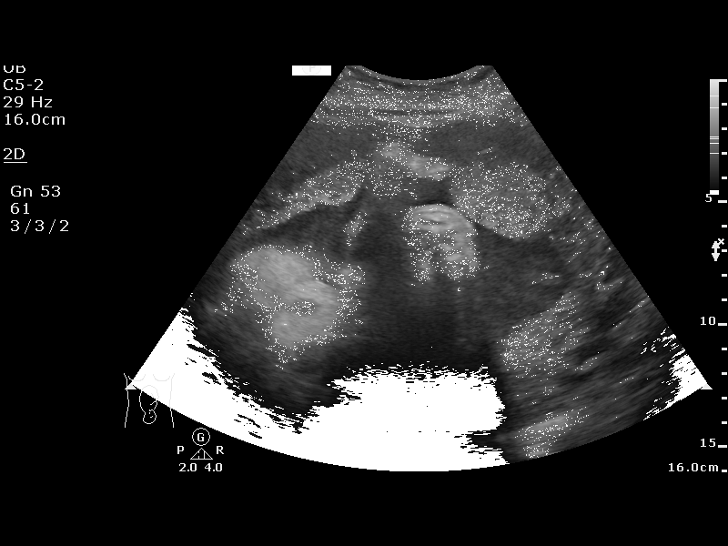
[im 10/14]
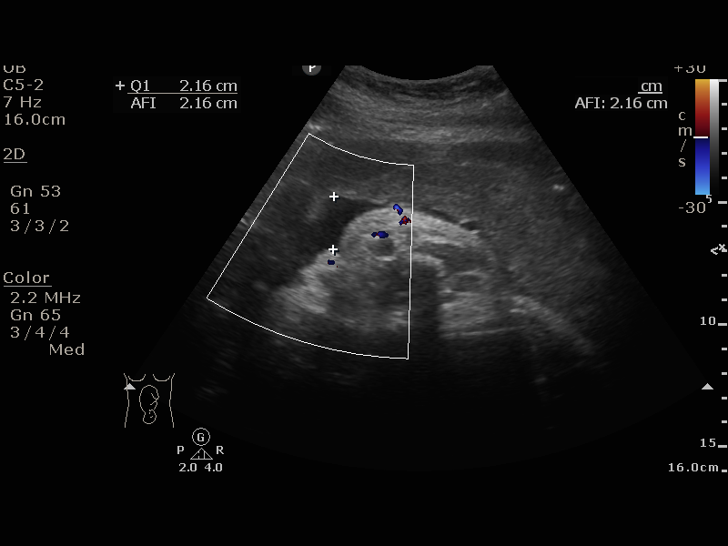
[im 11/14]
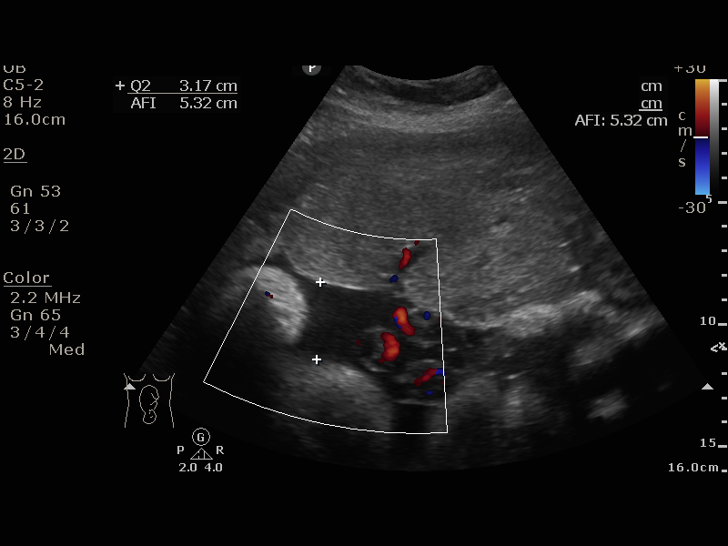
[im 12/14]
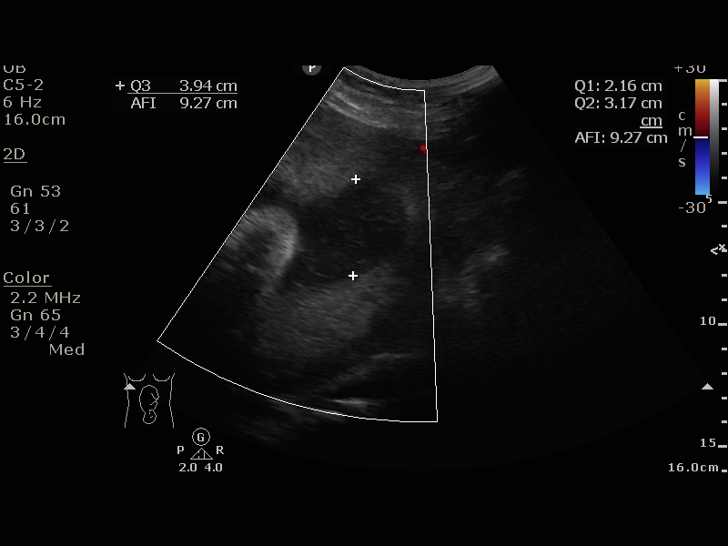
[im 13/14]
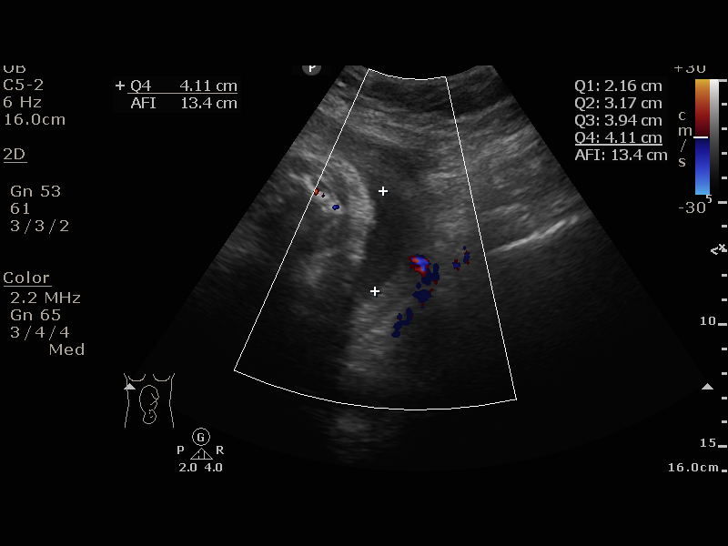
[im 14/14]
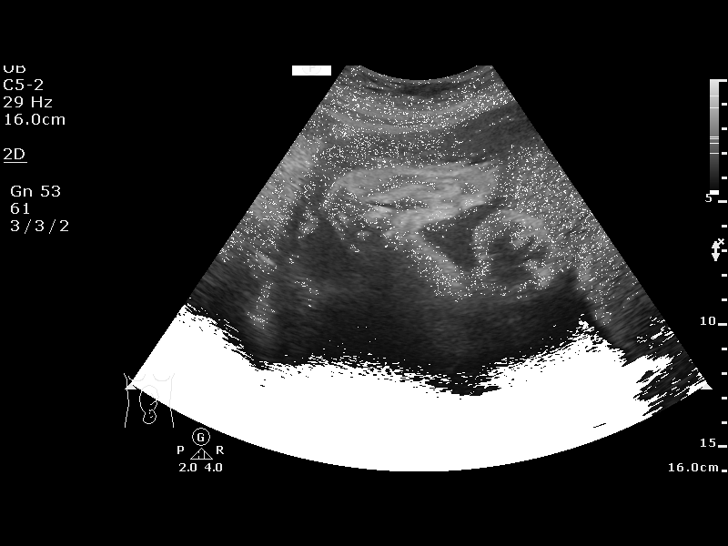

[13 of 14 positions shown; findings below may reference images not displayed]

[facility]
[REDACTED]

1  US FETAL BPP W/NONSTRESS                    76818.4

1  KRISTIAN BLAISE            [id number]      [id number]     [id number]
Service(s) Provided

Indications

38 weeks gestation of pregnancy
Unspecified pre-existing hypertension
complicating pregnancy, third trimester
Gestational diabetes in pregnancy,
controlled by oral hypoglycemic drugs
Advanced maternal age multigravida 35+,
third trimester
OB History

Blood Type:            Height:  5'1"   Weight (lb):  247       BMI:
Gravidity:    12        Term:   6        Prem:   0        SAB:   5
TOP:          0       Ectopic:  0        Living: 6
Fetal Evaluation

Num Of Fetuses:     1
Preg. Location:     Intrauterine
Cardiac Activity:   Observed
Presentation:       Cephalic
Amniotic Fluid
AFI FV:      Subjectively within normal limits

AFI Sum(cm)     %Tile       Largest Pocket(cm)
13.38           51

RUQ(cm)       RLQ(cm)       LUQ(cm)        LLQ(cm)
2.16
Biophysical Evaluation

Amniotic F.V:   Pocket => 2 cm two         F. Tone:        Observed
planes
F. Movement:    Observed                   N.S.T:          Reactive
F. Breathing:   Observed                   Score:          [DATE]
Gestational Age

LMP:           38w 2d        Date:  01/08/17                 EDD:   10/15/17
Best:          38w 2d     Det. By:  LMP  (01/08/17)          EDD:   10/15/17
Impression

Vertex
Recommendations

Pt schedueld for IOL next week

## 2018-06-05 ENCOUNTER — Ambulatory Visit: Payer: Medicaid Other

## 2018-06-08 ENCOUNTER — Ambulatory Visit: Payer: Self-pay

## 2018-06-11 ENCOUNTER — Ambulatory Visit: Payer: Self-pay

## 2018-09-11 ENCOUNTER — Other Ambulatory Visit: Payer: Self-pay | Admitting: Obstetrics & Gynecology

## 2018-09-11 DIAGNOSIS — N760 Acute vaginitis: Secondary | ICD-10-CM

## 2018-09-22 DIAGNOSIS — M79662 Pain in left lower leg: Secondary | ICD-10-CM | POA: Insufficient documentation

## 2018-09-22 DIAGNOSIS — M79675 Pain in left toe(s): Secondary | ICD-10-CM | POA: Insufficient documentation

## 2018-11-02 NOTE — Progress Notes (Deleted)
Office Visit Note  Patient: Kelsey Hunter             Date of Birth: [birth year]           MRN: [id number]             PCP: Patient, No Pcp Per Referring: Ermalene Postin, NP Visit Date: 11/16/2018 Occupation: @GUAROCC @  Subjective:  No chief complaint on file.   History of Present Illness: Kelsey Hunter is a 43 y.o. female ***   Activities of Daily Living:  Patient reports morning stiffness for *** {minute/hour:19697}.   Patient {ACTIONS;DENIES/REPORTS:21021675::"Denies"} nocturnal pain.  Difficulty dressing/grooming: {ACTIONS;DENIES/REPORTS:21021675::"Denies"} Difficulty climbing stairs: {ACTIONS;DENIES/REPORTS:21021675::"Denies"} Difficulty getting out of chair: {ACTIONS;DENIES/REPORTS:21021675::"Denies"} Difficulty using hands for taps, buttons, cutlery, and/or writing: {ACTIONS;DENIES/REPORTS:21021675::"Denies"}  No Rheumatology ROS completed.   PMFS History:  Patient Active Problem List   Diagnosis Date Noted  . Gestational diabetes mellitus, class A2 10/08/2017  . SVD (spontaneous vaginal delivery) 10/08/2017  . Gestational diabetes mellitus (GDM) in third trimester 08/07/2017  . Supervision of high risk pregnancy, antepartum 04/14/2017  . Chronic hypertension during pregnancy, antepartum 04/14/2017  . Advanced maternal age in multigravida 04/14/2017  . Encounter for supervision of pregnancy with grand mulitparity in second trimester, antepartum 04/14/2017  . Morbid obesity ([place]) 04/14/2017    Past Medical History:  Diagnosis Date  . Asthma    inhaler when needed  . Hypertension     Family History  Problem Relation Age of Onset  . Kidney disease Father   . Heart disease Father    Past Surgical History:  Procedure Laterality Date  . CHOLECYSTECTOMY  03/2000  . TUBAL LIGATION Bilateral 10/09/2017   Procedure: POST PARTUM TUBAL LIGATION;  Surgeon: Aletha Halim, MD;  Location: [place];  Service: Gynecology;  Laterality: Bilateral;   Social  History   Social History Narrative  . Not on file   Immunization History  Administered Date(s) Administered  . Influenza,inj,Quad PF,6+ Mos 04/14/2017  . Tdap 07/24/2017     Objective: Vital Signs: There were no vitals taken for this visit.   Physical Exam   Musculoskeletal Exam: ***  CDAI Exam: CDAI Score: - Patient Global: -; Provider Global: - Swollen: -; Tender: - Joint Exam   No joint exam has been documented for this visit   There is currently no information documented on the homunculus. Go to the Rheumatology activity and complete the homunculus joint exam.  Investigation: No additional findings.  Imaging: No results found.  Recent Labs: Lab Results  Component Value Date   WBC 8.3 10/08/2017   HGB 12.0 10/08/2017   PLT 161 10/08/2017   NA 135 10/08/2017   K 4.1 10/08/2017   CL 107 10/08/2017   CO2 18 (L) 10/08/2017   GLUCOSE 109 (H) 10/08/2017   BUN 8 10/08/2017   CREATININE 0.62 10/08/2017   BILITOT 0.5 10/08/2017   ALKPHOS 220 (H) 10/08/2017   AST 29 10/08/2017   ALT 24 10/08/2017   PROT 6.4 (L) 10/08/2017   ALBUMIN 2.6 (L) 10/08/2017   CALCIUM 9.0 10/08/2017   GFRAA >60 10/08/2017    Speciality Comments: No specialty comments available.  Procedures:  No procedures performed Allergies: Patient has no known allergies.   Assessment / Plan:     Visit Diagnoses: Rheumatoid arthritis with rheumatoid factor of multiple sites without organ or systems involvement (HCC)  Positive ANA (antinuclear antibody)  Elevated sed rate  History of asthma  Essential hypertension  Gestational diabetes mellitus (GDM) in third trimester controlled  on oral hypoglycemic drug  Chronic hypertension during pregnancy, antepartum   Orders: No orders of the defined types were placed in this encounter.  No orders of the defined types were placed in this encounter.   Face-to-face time spent with patient was *** minutes. Greater than 50% of time was spent in  counseling and coordination of care.  Follow-Up Instructions: No follow-ups on file.   Gearldine Bienenstockaylor M Dale, PA-C  Note - This record has been created using Dragon software.  Chart creation errors have been sought, but may not always  have been located. Such creation errors do not reflect on  the standard of medical care.

## 2018-11-16 ENCOUNTER — Ambulatory Visit: Payer: Medicaid Other | Admitting: Rheumatology

## 2018-12-14 ENCOUNTER — Ambulatory Visit: Payer: Medicaid Other | Admitting: Rheumatology

## 2018-12-29 ENCOUNTER — Other Ambulatory Visit: Payer: Self-pay

## 2018-12-29 ENCOUNTER — Encounter (HOSPITAL_COMMUNITY): Payer: Self-pay | Admitting: Emergency Medicine

## 2018-12-29 ENCOUNTER — Ambulatory Visit (HOSPITAL_COMMUNITY)
Admission: EM | Admit: 2018-12-29 | Discharge: 2018-12-29 | Disposition: A | Discharge status: Home / Self Care | Payer: Medicaid Other | Attending: Emergency Medicine | Admitting: Emergency Medicine

## 2018-12-29 DIAGNOSIS — K0889 Other specified disorders of teeth and supporting structures: Secondary | ICD-10-CM | POA: Diagnosis not present

## 2018-12-29 MED ORDER — AMOXICILLIN 500 MG PO CAPS: 500.0000 mg | ORAL_CAPSULE | Freq: Three times a day (TID) | ORAL | 0 refills | Status: DC

## 2018-12-29 MED ORDER — IBUPROFEN 800 MG PO TABS: 800.0000 mg | ORAL_TABLET | Freq: Three times a day (TID) | ORAL | 0 refills | Status: DC | PRN

## 2018-12-29 NOTE — ED Provider Notes (Signed)
[place]    CSN: [id number] Arrival date & time: 12/29/18  1428     History   Chief Complaint Chief Complaint  Patient presents with  . Dental Pain    HPI Kelsey Hunter is a 43 y.o. female.   Patient presents with pain in her right lower teeth since yesterday.  She states the pain is worse with chewing or touching the teeth.  She states she has been taking Tylenol and ibuprofen with minimal relief.  She has an appointment with her dentist on 01/14/2019.  She denies fever, chills, difficulty swallowing, difficulty breathing.    The history is provided by the patient.    Past Medical History:  Diagnosis Date  . Asthma    inhaler when needed  . Hypertension     Patient Active Problem List   Diagnosis Date Noted  . Gestational diabetes mellitus, class A2 10/08/2017  . SVD (spontaneous vaginal delivery) 10/08/2017  . Gestational diabetes mellitus (GDM) in third trimester 08/07/2017  . Supervision of high risk pregnancy, antepartum 04/14/2017  . Chronic hypertension during pregnancy, antepartum 04/14/2017  . Advanced maternal age in multigravida 04/14/2017  . Encounter for supervision of pregnancy with grand mulitparity in second trimester, antepartum 04/14/2017  . Morbid obesity ([place]) 04/14/2017    Past Surgical History:  Procedure Laterality Date  . CHOLECYSTECTOMY  03/2000  . TUBAL LIGATION Bilateral 10/09/2017   Procedure: POST PARTUM TUBAL LIGATION;  Surgeon: Aletha Halim, MD;  Location: [place];  Service: Gynecology;  Laterality: Bilateral;    OB History    Gravida  12   Para  7   Term  7   Preterm      AB  5   Living  7     SAB  5   TAB      Ectopic      Multiple  0   Live Births  7            Home Medications    Prior to Admission medications   Medication Sig Start Date End Date Taking? Authorizing Provider  ACCU-CHEK FASTCLIX LANCETS MISC 1 Device by Percutaneous route 4 (four) times daily. 08/07/17    Chancy Milroy, MD  acetaminophen (TYLENOL) 325 MG tablet Take 2 tablets (650 mg total) by mouth every 4 (four) hours as needed for mild pain. 10/10/17   Bonnita Hollow, MD  amLODipine (NORVASC) 5 MG tablet Take 1 tablet (5 mg total) by mouth daily. 10/10/17 10/10/18  Bonnita Hollow, MD  amoxicillin (AMOXIL) 500 MG capsule Take 1 capsule (500 mg total) by mouth 3 (three) times daily. 12/29/18   Sharion Balloon, NP  benzonatate (TESSALON) 100 MG capsule Take 1 capsule (100 mg total) by mouth every 8 (eight) hours. Patient not taking: Reported on 12/29/2018 01/28/18   Ok Edwards, PA-C  fluconazole (DIFLUCAN) 150 MG tablet TAKE ONE TABLET BY MOUTH AS A ONE-TIME DOSE Patient not taking: Reported on 12/29/2018 09/11/18   Woodroe Mode, MD  fluticasone [facility]) 50 MCG/ACT nasal spray Place 2 sprays into both nostrils daily. 01/28/18   Tasia Catchings, Amy V, PA-C  glucose blood (ACCU-CHEK GUIDE) test strip Use as instructed QID 08/07/17   Chancy Milroy, MD  ibuprofen (ADVIL) 800 MG tablet Take 1 tablet (800 mg total) by mouth every 8 (eight) hours as needed. 12/29/18   Sharion Balloon, NP  ipratropium (ATROVENT) 0.06 % nasal spray Place 2 sprays into both nostrils 4 (four) times  daily. 01/28/18   Belinda FisherYu, Amy V, PA-C  meloxicam (MOBIC) 7.5 MG tablet Take 1 tablet (7.5 mg total) by mouth daily. Patient not taking: Reported on 12/29/2018 11/28/17   Wurst, GrenadaBrittany, PA-C  metroNIDAZOLE (FLAGYL) 500 MG tablet Take 1 tablet by mouth twice daily for 7 days Patient not taking: Reported on 12/29/2018 09/14/18   Adam PhenixArnold, James G, MD  ondansetron (ZOFRAN ODT) 4 MG disintegrating tablet Take 1 tablet (4 mg total) by mouth every 8 (eight) hours as needed for nausea or vomiting. 01/28/18   Cathie HoopsYu, Amy V, PA-C  oxyCODONE (OXY IR/ROXICODONE) 5 MG immediate release tablet Take 1 tablet (5 mg total) by mouth every 4 (four) hours as needed for severe pain. Patient not taking: Reported on 12/29/2018 10/10/17   Garnette Gunnerhompson, Aaron B, MD  predniSONE (DELTASONE)  50 MG tablet Take 1 tablet (50 mg total) by mouth daily. Patient not taking: Reported on 12/29/2018 01/28/18   Belinda FisherYu, Amy V, PA-C  Prenatal Vit-Fe Fumarate-FA (PREPLUS) 27-1 MG TABS Take 1 tablet by mouth daily. Patient taking differently: Take 1 tablet by mouth at bedtime.  06/06/17   Hermina StaggersErvin, Michael L, MD    Family History Family History  Problem Relation Age of Onset  . Kidney disease Father   . Heart disease Father     Social History Social History   Tobacco Use  . Smoking status: Never Smoker  . Smokeless tobacco: Never Used  Substance Use Topics  . Alcohol use: No  . Drug use: No     Allergies   Patient has no known allergies.   Review of Systems Review of Systems  Constitutional: Negative for chills and fever.  HENT: Positive for dental problem. Negative for ear pain, sore throat and trouble swallowing.   Eyes: Negative for pain and visual disturbance.  Respiratory: Negative for cough and shortness of breath.   Cardiovascular: Negative for chest pain and palpitations.  Gastrointestinal: Negative for abdominal pain and vomiting.  Genitourinary: Negative for dysuria and hematuria.  Musculoskeletal: Negative for arthralgias and back pain.  Skin: Negative for color change and rash.  Neurological: Negative for seizures and syncope.  All other systems reviewed and are negative.    Physical Exam Triage Vital Signs ED Triage Vitals  Enc Vitals Group     BP 12/29/18 1505 (!) 147/96     Pulse Rate 12/29/18 1505 87     Resp 12/29/18 1505 18     Temp 12/29/18 1505 98.5 F (36.9 C)     Temp Source 12/29/18 1505 Oral     SpO2 12/29/18 1505 98 %     Weight --      Height --      Head Circumference --      Peak Flow --      Pain Score 12/29/18 1506 10     Pain Loc --      Pain Edu? --      Excl. in GC? --    No data found.  Updated Vital Signs BP (!) 147/96 (BP Location: Right Arm)   Pulse 87   Temp 98.5 F (36.9 C) (Oral)   Resp 18   SpO2 98%   Visual  Acuity Right Eye Distance:   Left Eye Distance:   Bilateral Distance:    Right Eye Near:   Left Eye Near:    Bilateral Near:     Physical Exam Vitals signs and nursing note reviewed.  Constitutional:      General: She is not  in acute distress.    Appearance: She is well-developed.  HENT:     Head: Normocephalic and atraumatic.     Right Ear: Tympanic membrane normal.     Left Ear: Tympanic membrane normal.     Nose: Nose normal.     Mouth/Throat:     Mouth: Mucous membranes are moist.     Dentition: Dental tenderness and dental caries present. No gingival swelling.     Pharynx: Oropharynx is clear. Uvula midline.      Comments: Teeth in poor repair.   Eyes:     Conjunctiva/sclera: Conjunctivae normal.  Neck:     Musculoskeletal: Neck supple.  Cardiovascular:     Rate and Rhythm: Normal rate and regular rhythm.     Heart sounds: No murmur.  Pulmonary:     Effort: Pulmonary effort is normal. No respiratory distress.     Breath sounds: Normal breath sounds.  Abdominal:     Palpations: Abdomen is soft.     Tenderness: There is no abdominal tenderness. There is no guarding or rebound.  Skin:    General: Skin is warm and dry.  Neurological:     Mental Status: She is alert.      UC Treatments / Results  Labs (all labs ordered are listed, but only abnormal results are displayed) Labs Reviewed - No data to display  EKG   Radiology No results found.  Procedures Procedures (including critical care time)  Medications Ordered in UC Medications - No data to display  Initial Impression / Assessment and Plan / UC Course  I have reviewed the triage vital signs and the nursing notes.  Pertinent labs & imaging results that were available during my care of the patient were reviewed by me and considered in my medical decision making (see chart for details).   Tooth ache.  Treating with amoxicillin and ibuprofen.  Patient has a dental appointment scheduled on 01/14/2019.   Dental resource guide provided.  Instructed patient to go to the ED if she has difficulty swallowing or breathing.  Instructed patient to return here or follow-up with her PCP if she develops worsening pain, fever, chills, gingival swelling, or other concerning symptoms.     Final Clinical Impressions(s) / UC Diagnoses   Final diagnoses:  Toothache     Discharge Instructions     Take the antibiotic amoxicillin as prescribed.  Take the ibuprofen as needed for your discomfort.    Follow-up with your dentist as scheduled.  A dental resource guide is attached.  Go to the emergency department if you have difficulty swallowing or breathing.    Return here or follow-up with your primary care provider if you develop worsening pain, fever, chills, swelling of your gums, or other concerning symptoms.        ED Prescriptions    Medication Sig Dispense Auth. Provider   amoxicillin (AMOXIL) 500 MG capsule Take 1 capsule (500 mg total) by mouth 3 (three) times daily. 21 capsule Wendee Beaversate, Kerigan Narvaez H, NP   ibuprofen (ADVIL) 800 MG tablet Take 1 tablet (800 mg total) by mouth every 8 (eight) hours as needed. 30 tablet Mickie Bailate, Aliyanah Rozas H, NP     Controlled Substance Prescriptions [place] Controlled Substance Registry consulted? Not Applicable   Mickie Bailate, Jeramia Saleeby H, NP 12/29/18 1541

## 2018-12-29 NOTE — ED Triage Notes (Signed)
Pt here for right sided dental pain 

## 2018-12-29 NOTE — Discharge Instructions (Addendum)
Take the antibiotic amoxicillin as prescribed.  Take the ibuprofen as needed for your discomfort.    Follow-up with your dentist as scheduled.  A dental resource guide is attached.  Go to the emergency department if you have difficulty swallowing or breathing.    Return here or follow-up with your primary care provider if you develop worsening pain, fever, chills, swelling of your gums, or other concerning symptoms.

## 2019-01-06 ENCOUNTER — Other Ambulatory Visit: Payer: Self-pay

## 2019-01-06 ENCOUNTER — Ambulatory Visit (HOSPITAL_COMMUNITY)
Admission: EM | Admit: 2019-01-06 | Discharge: 2019-01-06 | Disposition: A | Discharge status: Home / Self Care | Payer: Medicaid Other | Attending: Family Medicine | Admitting: Family Medicine

## 2019-01-06 ENCOUNTER — Ambulatory Visit (INDEPENDENT_AMBULATORY_CARE_PROVIDER_SITE_OTHER): Payer: Medicaid Other

## 2019-01-06 ENCOUNTER — Telehealth (HOSPITAL_COMMUNITY): Payer: Self-pay | Admitting: Family Medicine

## 2019-01-06 ENCOUNTER — Encounter (HOSPITAL_COMMUNITY): Payer: Self-pay

## 2019-01-06 ENCOUNTER — Ambulatory Visit (HOSPITAL_COMMUNITY): Payer: Medicaid Other

## 2019-01-06 ENCOUNTER — Ambulatory Visit (HOSPITAL_BASED_OUTPATIENT_CLINIC_OR_DEPARTMENT_OTHER): Admit: 2019-01-06 | Discharge: 2019-01-06 | Disposition: A | Discharge status: Home / Self Care | Payer: Medicaid Other

## 2019-01-06 DIAGNOSIS — M79605 Pain in left leg: Secondary | ICD-10-CM

## 2019-01-06 DIAGNOSIS — M79609 Pain in unspecified limb: Secondary | ICD-10-CM

## 2019-01-06 MED ORDER — TRAMADOL HCL 50 MG PO TABS: 50.0000 mg | ORAL_TABLET | Freq: Two times a day (BID) | ORAL | 0 refills | Status: AC | PRN

## 2019-01-06 MED ORDER — KETOROLAC TROMETHAMINE 30 MG/ML IJ SOLN
INTRAMUSCULAR | Status: AC
  Filled 2019-01-06: qty 1

## 2019-01-06 MED ORDER — CYCLOBENZAPRINE HCL 10 MG PO TABS: 10.0000 mg | ORAL_TABLET | Freq: Two times a day (BID) | ORAL | 0 refills | Status: DC | PRN

## 2019-01-06 MED ORDER — KETOROLAC TROMETHAMINE 30 MG/ML IJ SOLN
30.0000 mg | Freq: Once | INTRAMUSCULAR | Status: AC
  Administered 2019-01-06: 30 mg via INTRAMUSCULAR

## 2019-01-06 NOTE — Telephone Encounter (Signed)
Patient DVT study negative today.  Patient wanting something for pain. We will send in muscle relaxant and a few tramadol for the next couple days.

## 2019-01-06 NOTE — ED Triage Notes (Signed)
Pt states she has pain her left knee and leg pain.  Pt states it's been 3 days of pain.

## 2019-01-06 NOTE — ED Provider Notes (Signed)
[facility]    CSN: [id number] Arrival date & time: 01/06/19  1019      History   Chief Complaint Chief Complaint  Patient presents with   Knee Pain    HPI Danaija Eskridge is a 43 y.o. female.   Patient is a 43 year old female who presents today with knee pain.  This is been present and worsening over the past couple days.  Started after a fall while she was giving her child a bath.  Reports fell straight on the left knee and then fell over onto the left leg.  Patient is able to ambulate but with pain.  She has been taking ibuprofen for pain without much relief.  The pain radiates from her left knee down the left leg into the left calf.  There is some mild discoloration to the back of the calf with swelling.  The pain also radiates up the back of the left leg into the hamstring area.  No bruising or deformities to the hamstring.  She has been taking tylenol and ibuprofen without much relief.  Mild numbness and tingling that is intermittent in the leg.  She is able to ambulate.  ROS per HPI      Past Medical History:  Diagnosis Date   Asthma    inhaler when needed   Hypertension     Patient Active Problem List   Diagnosis Date Noted   Gestational diabetes mellitus, class A2 10/08/2017   SVD (spontaneous vaginal delivery) 10/08/2017   Gestational diabetes mellitus (GDM) in third trimester 08/07/2017   Supervision of high risk pregnancy, antepartum 04/14/2017   Chronic hypertension during pregnancy, antepartum 04/14/2017   Advanced maternal age in multigravida 04/14/2017   Encounter for supervision of pregnancy with grand mulitparity in second trimester, antepartum 04/14/2017   Morbid obesity (HCC) 04/14/2017    Past Surgical History:  Procedure Laterality Date   CHOLECYSTECTOMY  03/2000   TUBAL LIGATION Bilateral 10/09/2017   Procedure: POST PARTUM TUBAL LIGATION;  Surgeon: Nickerson Bing, MD;  Location: [facility] BIRTHING SUITES;  Service: Gynecology;   Laterality: Bilateral;    OB History    Gravida  12   Para  7   Term  7   Preterm      AB  5   Living  7     SAB  5   TAB      Ectopic      Multiple  0   Live Births  7            Home Medications    Prior to Admission medications   Medication Sig Start Date End Date Taking? Authorizing Provider  ACCU-CHEK FASTCLIX LANCETS MISC 1 Device by Percutaneous route 4 (four) times daily. 08/07/17   Hermina Staggers, MD  acetaminophen (TYLENOL) 325 MG tablet Take 2 tablets (650 mg total) by mouth every 4 (four) hours as needed for mild pain. 10/10/17   Garnette Gunner, MD  amLODipine (NORVASC) 5 MG tablet Take 1 tablet (5 mg total) by mouth daily. 10/10/17 10/10/18  Garnette Gunner, MD  cyclobenzaprine (FLEXERIL) 10 MG tablet Take 1 tablet (10 mg total) by mouth 2 (two) times daily as needed for muscle spasms. 01/06/19   Dahlia Byes A, NP  fluticasone (FLONASE) 50 MCG/ACT nasal spray Place 2 sprays into both nostrils daily. 01/28/18   Cathie Hoops, Amy V, PA-C  glucose blood (ACCU-CHEK GUIDE) test strip Use as instructed QID 08/07/17   Hermina Staggers, MD  ibuprofen (  ADVIL) 800 MG tablet Take 1 tablet (800 mg total) by mouth every 8 (eight) hours as needed. 12/29/18   Mickie Bailate, Kelly H, NP  ipratropium (ATROVENT) 0.06 % nasal spray Place 2 sprays into both nostrils 4 (four) times daily. 01/28/18   Cathie HoopsYu, Amy V, PA-C  Prenatal Vit-Fe Fumarate-FA (PREPLUS) 27-1 MG TABS Take 1 tablet by mouth daily. Patient taking differently: Take 1 tablet by mouth at bedtime.  06/06/17   Hermina StaggersErvin, Michael L, MD  traMADol (ULTRAM) 50 MG tablet Take 1 tablet (50 mg total) by mouth every 12 (twelve) hours as needed for up to 3 days. 01/06/19 01/09/19  Janace ArisBast, Ricki Clack A, NP    Family History Family History  Problem Relation Age of Onset   Kidney disease Father    Heart disease Father     Social History Social History   Tobacco Use   Smoking status: Never Smoker   Smokeless tobacco: Never Used  Substance Use  Topics   Alcohol use: No   Drug use: No     Allergies   Patient has no known allergies.   Review of Systems Review of Systems   Physical Exam Triage Vital Signs ED Triage Vitals  Enc Vitals Group     BP 01/06/19 1034 (!) 137/94     Pulse Rate 01/06/19 1034 70     Resp 01/06/19 1034 18     Temp 01/06/19 1034 98.7 F (37.1 C)     Temp Source 01/06/19 1034 Tympanic     SpO2 01/06/19 1034 98 %     Weight --      Height --      Head Circumference --      Peak Flow --      Pain Score 01/06/19 1031 10     Pain Loc --      Pain Edu? --      Excl. in GC? --    No data found.  Updated Vital Signs BP (!) 137/94 (BP Location: Right Arm)    Pulse 70    Temp 98.7 F (37.1 C) (Tympanic)    Resp 18    SpO2 98%   Visual Acuity Right Eye Distance:   Left Eye Distance:   Bilateral Distance:    Right Eye Near:   Left Eye Near:    Bilateral Near:     Physical Exam Vitals signs and nursing note reviewed.  Constitutional:      General: She is not in acute distress.    Appearance: Normal appearance. She is not ill-appearing, toxic-appearing or diaphoretic.  HENT:     Head: Normocephalic and atraumatic.     Nose: Nose normal.  Eyes:     Conjunctiva/sclera: Conjunctivae normal.  Neck:     Musculoskeletal: Normal range of motion.  Pulmonary:     Effort: Pulmonary effort is normal.  Musculoskeletal:     Left knee: She exhibits decreased range of motion, swelling and bony tenderness. She exhibits no effusion, no ecchymosis, no deformity, no laceration, no erythema, normal alignment, no LCL laxity, normal patellar mobility, normal meniscus and no MCL laxity. Tenderness found.     Comments: Tenderness to entire knee to include posterior knee.  Tenderness to left calf with mild swelling and slight discoloration. Tenderness to hamstring without deformity or bruising.  Neurological:     Mental Status: She is alert.  Psychiatric:        Mood and Affect: Mood normal.      UC  Treatments / Results  Labs (all labs ordered are listed, but only abnormal results are displayed) Labs Reviewed - No data to display  EKG   Radiology Dg Knee Complete 4 Views Left  Result Date: 01/06/2019 CLINICAL DATA:  Left knee pain after fall EXAM: LEFT KNEE - COMPLETE 4+ VIEW COMPARISON:  None. FINDINGS: No evidence of fracture, dislocation, or visible joint effusion. No evidence of arthropathy or other focal bone abnormality. Soft tissues are unremarkable. IMPRESSION: Negative. Electronically Signed   By: Duanne GuessNicholas  Plundo M.D.   On: 01/06/2019 11:44   [place] Venous  Result Date: 01/07/2019  Lower Venous Study Indications: Pain, and in left calf and posterior knee after falling.  Comparison Study: No priors. Performing Technologist: Marilynne Halstedita Sturdivant RDMS, RVT  Examination Guidelines: A complete evaluation includes B-mode imaging, spectral Doppler, color Doppler, and power Doppler as needed of all accessible portions of each vessel. Bilateral testing is considered an integral part of a complete examination. Limited examinations for reoccurring indications may be performed as noted.  +-----+---------------+---------+-----------+----------+--------------+  RIGHT Compressibility Phasicity Spontaneity Properties Thrombus Aging  +-----+---------------+---------+-----------+----------+--------------+  CFV   Full            Yes       Yes                                    +-----+---------------+---------+-----------+----------+--------------+  SFJ   Full                                                             +-----+---------------+---------+-----------+----------+--------------+   +---------+---------------+---------+-----------+----------+--------------+  LEFT      Compressibility Phasicity Spontaneity Properties Thrombus Aging  +---------+---------------+---------+-----------+----------+--------------+  CFV       Full            Yes       Yes                                     +---------+---------------+---------+-----------+----------+--------------+  SFJ       Full                                                             +---------+---------------+---------+-----------+----------+--------------+  FV Prox   Full                                                             +---------+---------------+---------+-----------+----------+--------------+  FV Mid    Full                                                             +---------+---------------+---------+-----------+----------+--------------+  FV Distal  Full                                                             +---------+---------------+---------+-----------+----------+--------------+  PFV       Full                                                             +---------+---------------+---------+-----------+----------+--------------+  POP       Full            Yes       Yes                                    +---------+---------------+---------+-----------+----------+--------------+  PTV       Full                                                             +---------+---------------+---------+-----------+----------+--------------+  PERO      Full                                                             +---------+---------------+---------+-----------+----------+--------------+     Summary: Right: No evidence of common femoral vein obstruction. Left: There is no evidence of deep vein thrombosis in the lower extremity. No cystic structure found in the popliteal fossa.  *See table(s) above for measurements and observations. Electronically signed by Coral Else MD on 01/07/2019 at 7:34:21 AM.    Final     Procedures Procedures (including critical care time)  Medications Ordered in UC Medications  ketorolac (TORADOL) 30 MG/ML injection 30 mg (30 mg Intramuscular Given 01/06/19 1213)  ketorolac (TORADOL) 30 MG/ML injection (has no administration in time range)    Initial Impression / Assessment and Plan / UC Course    I have reviewed the triage vital signs and the nursing notes.  Pertinent labs & imaging results that were available during my care of the patient were reviewed by me and considered in my medical decision making (see chart for details).     X-ray was negative for any acute abnormalities.  Sending for DVT study negative. Toradol given here in clinic for pain Sent Flexeril for muscle accident and Toradol for moderate to severe pain. Recommended follow-up for any continued or worsening problems. Final Clinical Impressions(s) / UC Diagnoses   Final diagnoses:  Left leg pain     Discharge Instructions     Your x ray was normal Sending your for ultrasound.  Toradol for pain      ED Prescriptions    None     Controlled Substance Prescriptions [place] Controlled Substance Registry consulted? Not Applicable   Dahlia Byes  A, NP 01/07/19 1130

## 2019-01-06 NOTE — Progress Notes (Signed)
Left lower ext venous  has been completed. Refer to [facility] under chart review to view preliminary results.   01/06/2019  3:51 PM Omair Dettmer, Bonnye Fava

## 2019-01-06 NOTE — Discharge Instructions (Addendum)
Your x ray was normal Sending your for ultrasound.  Toradol for pain

## 2019-01-19 ENCOUNTER — Telehealth: Payer: Self-pay | Admitting: Student

## 2019-01-19 MED ORDER — FLUCONAZOLE 150 MG PO TABS: 150.0000 mg | ORAL_TABLET | Freq: Once | ORAL | 0 refills | Status: AC

## 2019-01-19 MED ORDER — METRONIDAZOLE 500 MG PO TABS: 500.0000 mg | ORAL_TABLET | Freq: Two times a day (BID) | ORAL | 0 refills | Status: DC

## 2019-01-19 NOTE — Telephone Encounter (Signed)
The patient stated she has a fishy smell and burning when she urinates. She stated she feels that she has a yeast infection or bacteria infection. She would like medication sent to a pharmacy. She also stated she doesn't have a phone right now and would like to be contacted by [phone].

## 2019-01-19 NOTE — Telephone Encounter (Signed)
Pt returned call and I informed the pt that I recommended that she come into the office so that we can do an urinalysis.  Pt reports that she works five days a week from 8-8 and will not be able to come in.  I encouraged pt to go to Urgent Care.  Pt stated that she will just go to the ER due to her work schedule and not wanting to wait until the weekend.   Per Diane Day, she was going to recommend that the pt be treated for BV and then potential yeast infection to see if that may help her sx's and if it does not to please call the office.  I called pt and LM with Diane's recommendation- that I have sent to two Rx's to her [facility] on [address].  In hopes that it will help with her sx's.  If she could please take the 7 day medication first then the single tablet after if needed. If she has any questions to please call the office.

## 2019-01-19 NOTE — Telephone Encounter (Signed)
Called pt @ [phone] as requested. She did not answer and a generic outgoing voice mail message was heard. I left message stating that I was returning a call from [place] and would call back. Per standing order, pt can have Rx for Metronidazole to treat presumptive BV and Diflucan to treat possible yeast. If pt continues to have sx of UTI once these medications have been completed or if her sx become worse, she may contact office for nurse visit appt.

## 2019-01-19 NOTE — Addendum Note (Signed)
Addended by: Michel Harrow on: 01/19/2019 02:40 PM   Modules accepted: Orders

## 2019-02-11 ENCOUNTER — Ambulatory Visit (INDEPENDENT_AMBULATORY_CARE_PROVIDER_SITE_OTHER): Payer: Medicaid Other | Admitting: Obstetrics and Gynecology

## 2019-02-11 ENCOUNTER — Encounter: Payer: Self-pay | Admitting: Obstetrics and Gynecology

## 2019-02-11 ENCOUNTER — Other Ambulatory Visit (HOSPITAL_COMMUNITY)
Admission: RE | Admit: 2019-02-11 | Discharge: 2019-02-11 | Disposition: A | Discharge status: Home / Self Care | Payer: Medicaid Other | Source: Ambulatory Visit | Attending: Obstetrics and Gynecology | Admitting: Obstetrics and Gynecology

## 2019-02-11 ENCOUNTER — Other Ambulatory Visit: Payer: Self-pay

## 2019-02-11 VITALS — Wt 242.7 lb

## 2019-02-11 DIAGNOSIS — Z0000 Encounter for general adult medical examination without abnormal findings: Secondary | ICD-10-CM

## 2019-02-11 DIAGNOSIS — Z01419 Encounter for gynecological examination (general) (routine) without abnormal findings: Secondary | ICD-10-CM

## 2019-02-11 DIAGNOSIS — Z23 Encounter for immunization: Secondary | ICD-10-CM | POA: Diagnosis not present

## 2019-02-11 DIAGNOSIS — R350 Frequency of micturition: Secondary | ICD-10-CM | POA: Diagnosis not present

## 2019-02-11 NOTE — Progress Notes (Signed)
GYNECOLOGY ANNUAL PREVENTATIVE CARE ENCOUNTER NOTE  History:     Kelsey Hunter is a 43 y.o. [id number] female here for a routine annual gynecologic exam.  Current complaints: None.   Denies abnormal vaginal bleeding, discharge, problems with intercourse or other gynecologic concerns.  Pelvic pain that comes and goes. Not associated with anything. Noticed after last baby with tubal.    Gynecologic History No LMP recorded. Patient has had an ablation. Contraception: tubal ligation Last Pap: 2018. Results were: normal with negative HPV Last mammogram: scheduled   Obstetric History OB History  Gravida Para Term Preterm AB Living  12 7 7   5 7   SAB TAB Ectopic Multiple Live Births  5     0 7    # Outcome Date GA Lbr Len/2nd Weight Sex Delivery Anes PTL Lv  12 Term 10/08/17 [redacted]w[redacted]d 02:26 / 00:15 7 lb 1.6 oz (3.221 kg) F Vag-Spont EPI  LIV     Birth Comments: WNL   11 SAB 2017          10 SAB 2016          9 SAB 2014          8 SAB 11/2011          7 SAB 05/2011          6 Term 2011 [redacted]w[redacted]d  7 lb 12 oz (3.515 kg) F Vag-Spont None N LIV  5 Term 2010 [redacted]w[redacted]d  7 lb 6 oz (3.345 kg) F Vag-Spont None N LIV  4 Term 2004 [redacted]w[redacted]d  9 lb 7 oz (4.281 kg) F Vag-Spont EPI N LIV  3 Term 2001 [redacted]w[redacted]d  6 lb 12 oz (3.062 kg) F Vag-Spont None N LIV  2 Term 1994 [redacted]w[redacted]d  6 lb 6 oz (2.892 kg) F Vag-Spont None N LIV  1 Term 1992 [redacted]w[redacted]d  7 lb 7 oz (3.374 kg) F Vag-Spont  N LIV    Past Medical History:  Diagnosis Date  . Asthma    inhaler when needed  . Hypertension     Past Surgical History:  Procedure Laterality Date  . CHOLECYSTECTOMY  03/2000  . TUBAL LIGATION Bilateral 10/09/2017   Procedure: POST PARTUM TUBAL LIGATION;  Surgeon: 10/11/2017, MD;  Location: [facility] BIRTHING SUITES;  Service: Gynecology;  Laterality: Bilateral;    Current Outpatient Medications on File Prior to Visit  Medication Sig Dispense Refill  . ACCU-CHEK FASTCLIX LANCETS MISC 1 Device by Percutaneous route 4 (four) times daily.  (Patient not taking: Reported on 02/11/2019) 100 each 12  . acetaminophen (TYLENOL) 325 MG tablet Take 2 tablets (650 mg total) by mouth every 4 (four) hours as needed for mild pain. (Patient not taking: Reported on 02/11/2019) 30 tablet 0  . amLODipine (NORVASC) 5 MG tablet Take 1 tablet (5 mg total) by mouth daily. 30 tablet 11  . cyclobenzaprine (FLEXERIL) 10 MG tablet Take 1 tablet (10 mg total) by mouth 2 (two) times daily as needed for muscle spasms. (Patient not taking: Reported on 02/11/2019) 20 tablet 0  . fluticasone (FLONASE) 50 MCG/ACT nasal spray Place 2 sprays into both nostrils daily. (Patient not taking: Reported on 02/11/2019) 1 g 0  . glucose blood (ACCU-CHEK GUIDE) test strip Use as instructed QID (Patient not taking: Reported on 02/11/2019) 100 each 12  . ibuprofen (ADVIL) 800 MG tablet Take 1 tablet (800 mg total) by mouth every 8 (eight) hours as needed. (Patient not taking: Reported on 02/11/2019) 30 tablet 0  .  ipratropium (ATROVENT) 0.06 % nasal spray Place 2 sprays into both nostrils 4 (four) times daily. (Patient not taking: Reported on 02/11/2019) 15 mL 0  . metroNIDAZOLE (FLAGYL) 500 MG tablet Take 1 tablet (500 mg total) by mouth 2 (two) times daily. (Patient not taking: Reported on 02/11/2019) 14 tablet 0  . Prenatal Vit-Fe Fumarate-FA (PREPLUS) 27-1 MG TABS Take 1 tablet by mouth daily. (Patient not taking: Reported on 02/11/2019) 30 tablet 13   No current facility-administered medications on file prior to visit.     No Known Allergies  Social History:  reports that she has never smoked. She has never used smokeless tobacco. She reports that she does not drink alcohol or use drugs.  Family History  Problem Relation Age of Onset  . Kidney disease Father   . Heart disease Father     The following portions of the patient's history were reviewed and updated as appropriate: allergies, current medications, past family history, past medical history, past social history, past  surgical history and problem list.  Review of Systems Pertinent items noted in HPI and remainder of comprehensive ROS otherwise negative.  Physical Exam:  Wt 242 lb 11.2 oz (110.1 kg)   BMI 45.86 kg/m  CONSTITUTIONAL: Well-developed, well-nourished female in no acute distress.  HENT:  Normocephalic, atraumatic, External right and left ear normal. Oropharynx is clear and moist EYES: Conjunctivae and EOM are normal. Pupils are equal, round, and reactive to light. No scleral icterus.  NECK: Normal range of motion, supple, no masses.  Normal thyroid.  SKIN: Skin is warm and dry. No rash noted. Not diaphoretic. No erythema. No pallor. MUSCULOSKELETAL: Normal range of motion. No tenderness.  No cyanosis, clubbing, or edema.  2+ distal pulses. NEUROLOGIC: Alert and oriented to person, place, and time. Normal reflexes, muscle tone coordination. No cranial nerve deficit noted. PSYCHIATRIC: Normal mood and affect. Normal behavior. Normal judgment and thought content. CARDIOVASCULAR: Normal heart rate noted, regular rhythm RESPIRATORY: Clear to auscultation bilaterally. Effort and breath sounds normal, no problems with respiration noted. BREASTS: Symmetric in size. No masses, skin changes, nipple drainage, or lymphadenopathy. ABDOMEN: Soft, normal bowel sounds, no distention noted.  No tenderness, rebound or guarding.  PELVIC: Normal appearing external genitalia; normal appearing vaginal mucosa and cervix.  No abnormal discharge noted.  Pap smear obtained.  Normal uterine size, no other palpable masses, no uterine or adnexal tenderness. Exam difficult d/t habitus.    Assessment and Plan:   1. Encounter for annual physical exam  - Cytology - PAP( [facility])  2. Women's annual routine gynecological examination  - US Pelvis Complete; Future- to evaluate for fibroids. Strong family history. Recent abdominal cramping w/o bleeding  - Urinalysis, Routine w reflex microscopic - MM Digital Screening;  Future - flu shot today.  - UA   Will follow up results of pap smear and manage accordingly. Mammogram scheduled Routine preventative health maintenance measures emphasized. Please refer to After Visit Summary for other counseling recommendations.     , Artist Pais, [place] for [employer], [place]

## 2019-02-12 LAB — URINALYSIS, ROUTINE W REFLEX MICROSCOPIC
Bilirubin, UA: NEGATIVE
Glucose, UA: NEGATIVE
Ketones, UA: NEGATIVE
Leukocytes,UA: NEGATIVE
Nitrite, UA: NEGATIVE
Protein,UA: NEGATIVE
RBC, UA: NEGATIVE
Specific Gravity, UA: 1.028 (ref 1.005–1.030)
Urobilinogen, Ur: 0.2 mg/dL (ref 0.2–1.0)
pH, UA: 5.5 (ref 5.0–7.5)

## 2019-02-17 ENCOUNTER — Ambulatory Visit (HOSPITAL_COMMUNITY): Admission: RE | Admit: 2019-02-17 | Payer: Medicaid Other | Source: Ambulatory Visit

## 2019-02-17 LAB — CYTOLOGY - PAP
Adequacy: ABSENT
Chlamydia: NEGATIVE
Diagnosis: NEGATIVE
High risk HPV: NEGATIVE
Neisseria Gonorrhea: NEGATIVE

## 2019-02-24 ENCOUNTER — Ambulatory Visit (HOSPITAL_COMMUNITY): Payer: Medicaid Other | Attending: Obstetrics and Gynecology

## 2019-03-31 ENCOUNTER — Ambulatory Visit: Payer: Medicaid Other

## 2019-05-26 ENCOUNTER — Ambulatory Visit: Payer: Medicaid Other

## 2019-06-24 ENCOUNTER — Other Ambulatory Visit: Payer: Self-pay | Admitting: Sports Medicine

## 2019-06-24 DIAGNOSIS — M25551 Pain in right hip: Secondary | ICD-10-CM

## 2019-07-14 ENCOUNTER — Other Ambulatory Visit: Payer: Self-pay

## 2019-07-14 ENCOUNTER — Other Ambulatory Visit: Payer: Medicaid Other

## 2019-07-14 ENCOUNTER — Ambulatory Visit
Admission: RE | Admit: 2019-07-14 | Discharge: 2019-07-14 | Disposition: A | Discharge status: Home / Self Care | Payer: Medicaid Other | Source: Ambulatory Visit | Attending: Sports Medicine | Admitting: Sports Medicine

## 2019-07-14 DIAGNOSIS — M25551 Pain in right hip: Secondary | ICD-10-CM

## 2019-10-08 DIAGNOSIS — E785 Hyperlipidemia, unspecified: Secondary | ICD-10-CM | POA: Insufficient documentation

## 2019-10-19 ENCOUNTER — Ambulatory Visit (HOSPITAL_COMMUNITY)
Admission: EM | Admit: 2019-10-19 | Discharge: 2019-10-19 | Disposition: A | Discharge status: Home / Self Care | Payer: Medicaid Other | Attending: Family Medicine | Admitting: Family Medicine

## 2019-10-19 ENCOUNTER — Encounter (HOSPITAL_COMMUNITY): Payer: Self-pay

## 2019-10-19 ENCOUNTER — Other Ambulatory Visit: Payer: Self-pay

## 2019-10-19 DIAGNOSIS — K047 Periapical abscess without sinus: Secondary | ICD-10-CM

## 2019-10-19 DIAGNOSIS — K0889 Other specified disorders of teeth and supporting structures: Secondary | ICD-10-CM

## 2019-10-19 MED ORDER — HYDROCODONE-ACETAMINOPHEN 5-325 MG PO TABS
ORAL_TABLET | ORAL | Status: AC
  Filled 2019-10-19: qty 1

## 2019-10-19 MED ORDER — DIPHENHYDRAMINE HCL 25 MG PO CAPS
ORAL_CAPSULE | ORAL | Status: AC
  Filled 2019-10-19: qty 1

## 2019-10-19 MED ORDER — PENICILLIN V POTASSIUM 500 MG PO TABS: 500.0000 mg | ORAL_TABLET | Freq: Two times a day (BID) | ORAL | 0 refills | Status: AC

## 2019-10-19 MED ORDER — HYDROCODONE-ACETAMINOPHEN 5-325 MG PO TABS
1.0000 | ORAL_TABLET | Freq: Once | ORAL | Status: AC
  Administered 2019-10-19: 1 via ORAL

## 2019-10-19 MED ORDER — DIPHENHYDRAMINE HCL 25 MG PO CAPS
25.0000 mg | ORAL_CAPSULE | Freq: Once | ORAL | Status: AC
  Administered 2019-10-19: 25 mg via ORAL

## 2019-10-19 MED ORDER — TRAMADOL HCL 50 MG PO TABS: 50.0000 mg | ORAL_TABLET | Freq: Four times a day (QID) | ORAL | 0 refills | Status: DC | PRN

## 2019-10-19 NOTE — ED Provider Notes (Signed)
[facility]    CSN: [id number] Arrival date & time: 10/19/19  1552      History   Chief Complaint Chief Complaint  Patient presents with   Dental Pain    HPI Kelsey Hunter is a 44 y.o. female.   HPI  Patient is here after seeing her dentist today.  She had 2 teeth pulled.  She states that her dentist was unwilling to provide pain medicine.  She states she is in "severe" pain.  She states the teeth were infected.  She states that she is having drainage from the wound.  She also requests antibiotics. She never has a history of addiction to opiates or alcohol   Past Medical History:  Diagnosis Date   Asthma    inhaler when needed   Hypertension     Patient Active Problem List   Diagnosis Date Noted   Pain of left calf 09/22/2018   Pain of toe of left foot 09/22/2018   Gestational diabetes mellitus, class A2 10/08/2017   SVD (spontaneous vaginal delivery) 10/08/2017   Gestational diabetes mellitus (GDM) in third trimester 08/07/2017   Supervision of high risk pregnancy, antepartum 04/14/2017   Chronic hypertension during pregnancy, antepartum 04/14/2017   Advanced maternal age in multigravida 04/14/2017   Encounter for supervision of pregnancy with grand mulitparity in second trimester, antepartum 04/14/2017   Morbid obesity (HCC) 04/14/2017    Past Surgical History:  Procedure Laterality Date   CHOLECYSTECTOMY  03/2000   TUBAL LIGATION Bilateral 10/09/2017   Procedure: POST PARTUM TUBAL LIGATION;  Surgeon: [place] Bing, MD;  Location: [facility] BIRTHING SUITES;  Service: Gynecology;  Laterality: Bilateral;    OB History    Gravida  12   Para  7   Term  7   Preterm      AB  5   Living  7     SAB  5   TAB      Ectopic      Multiple  0   Live Births  7            Home Medications    Prior to Admission medications   Medication Sig Start Date End Date Taking? Authorizing Provider  ACCU-CHEK FASTCLIX LANCETS MISC 1  Device by Percutaneous route 4 (four) times daily. Patient not taking: Reported on 02/11/2019 08/07/17   Hermina Staggers, MD  acetaminophen (TYLENOL) 325 MG tablet Take 2 tablets (650 mg total) by mouth every 4 (four) hours as needed for mild pain. Patient not taking: Reported on 02/11/2019 10/10/17   Garnette Gunner, MD  amLODipine (NORVASC) 5 MG tablet Take 1 tablet (5 mg total) by mouth daily. 10/10/17 10/10/18  Garnette Gunner, MD  cyclobenzaprine (FLEXERIL) 10 MG tablet Take 1 tablet (10 mg total) by mouth 2 (two) times daily as needed for muscle spasms. Patient not taking: Reported on 02/11/2019 01/06/19   Dahlia Byes A, NP  fluticasone (FLONASE) 50 MCG/ACT nasal spray Place 2 sprays into both nostrils daily. Patient not taking: Reported on 02/11/2019 01/28/18   Belinda Fisher, PA-C  glucose blood (ACCU-CHEK GUIDE) test strip Use as instructed QID Patient not taking: Reported on 02/11/2019 08/07/17   Hermina Staggers, MD  ibuprofen (ADVIL) 800 MG tablet Take 1 tablet (800 mg total) by mouth every 8 (eight) hours as needed. Patient not taking: Reported on 02/11/2019 12/29/18   Mickie Bail, NP  ipratropium (ATROVENT) 0.06 % nasal spray Place 2 sprays into both nostrils 4 (  four) times daily. Patient not taking: Reported on 02/11/2019 01/28/18   Belinda Fisher, PA-C  metroNIDAZOLE (FLAGYL) 500 MG tablet Take 1 tablet (500 mg total) by mouth 2 (two) times daily. Patient not taking: Reported on 02/11/2019 01/19/19   Judeth Horn, NP  penicillin v potassium (VEETID) 500 MG tablet Take 1 tablet (500 mg total) by mouth 2 (two) times daily for 10 days. 10/19/19 10/29/19  Eustace Moore, MD  Prenatal Vit-Fe Fumarate-FA (PREPLUS) 27-1 MG TABS Take 1 tablet by mouth daily. Patient not taking: Reported on 02/11/2019 06/06/17   Hermina Staggers, MD  traMADol (ULTRAM) 50 MG tablet Take 1 tablet (50 mg total) by mouth every 6 (six) hours as needed. 10/19/19   Eustace Moore, MD    Family History Family History  Problem  Relation Age of Onset   Kidney disease Father    Heart disease Father     Social History Social History   Tobacco Use   Smoking status: Never Smoker   Smokeless tobacco: Never Used  Substance Use Topics   Alcohol use: No   Drug use: No     Allergies   Tylenol [acetaminophen]   Review of Systems Review of Systems  HENT: Positive for dental problem.      Physical Exam Triage Vital Signs ED Triage Vitals [10/19/19 1638]  Enc Vitals Group     BP (!) 144/82     Pulse Rate 91     Resp 16     Temp 98.9 F (37.2 C)     Temp Source Tympanic     SpO2 100 %     Weight      Height      Head Circumference      Peak Flow      Pain Score 10     Pain Loc      Pain Edu?      Excl. in GC?    No data found.  Updated Vital Signs BP (!) 144/82 (BP Location: Right Arm)    Pulse 91    Temp 98.9 F (37.2 C) (Tympanic)    Resp 16    LMP 10/15/2019 (Exact Date)    SpO2 100%      Physical Exam Constitutional:      General: She is in acute distress.     Appearance: She is well-developed.     Comments: Patient is tearful.  Holding her face and her hands.  Obviously upset  HENT:     Head: Normocephalic and atraumatic.     Mouth/Throat:     Comments: Mask in place.  Oropharynx is examined.  The right posterior 2 molars have been removed.  Sockets are visible.  Redness of the gums is present Eyes:     Conjunctiva/sclera: Conjunctivae normal.     Pupils: Pupils are equal, round, and reactive to light.  Cardiovascular:     Rate and Rhythm: Normal rate.  Pulmonary:     Effort: Pulmonary effort is normal. No respiratory distress.  Abdominal:     General: There is no distension.     Palpations: Abdomen is soft.  Musculoskeletal:        General: Normal range of motion.     Cervical back: Normal range of motion.  Skin:    General: Skin is warm and dry.  Neurological:     General: No focal deficit present.     Mental Status: She is alert.      UC Treatments /  Results  Labs (all labs ordered are listed, but only abnormal results are displayed) Labs Reviewed - No data to display  EKG   Radiology No results found.  Procedures Procedures (including critical care time)  Medications Ordered in UC Medications  HYDROcodone-acetaminophen (NORCO/VICODIN) 5-325 MG per tablet 1 tablet (1 tablet Oral Given 10/19/19 1703)  diphenhydrAMINE (BENADRYL) capsule 25 mg (25 mg Oral Given 10/19/19 1702)    Initial Impression / Assessment and Plan / UC Course  I have reviewed the triage vital signs and the nursing notes.  Pertinent labs & imaging results that were available during my care of the patient were reviewed by me and considered in my medical decision making (see chart for details).     Checked the [employer].  No red flags or concern for addiction.  We will give her pain medication and antibiotics. Final Clinical Impressions(s) / UC Diagnoses   Final diagnoses:  Pain, dental  Dental infection     Discharge Instructions     Take the antibiotic 2 x a day Take the tramadol as needed for pain, take with food Follow up with your dentist   ED Prescriptions    Medication Sig Dispense Auth. Provider   penicillin v potassium (VEETID) 500 MG tablet Take 1 tablet (500 mg total) by mouth 2 (two) times daily for 10 days. 20 tablet Raylene Everts, MD   traMADol (ULTRAM) 50 MG tablet Take 1 tablet (50 mg total) by mouth every 6 (six) hours as needed. 15 tablet Raylene Everts, MD     I have reviewed the PDMP during this encounter.   Raylene Everts, MD 10/19/19 Vernelle Emerald

## 2019-10-19 NOTE — Discharge Instructions (Addendum)
Take the antibiotic 2 x a day Take the tramadol as needed for pain, take with food Follow up with your dentist

## 2019-10-19 NOTE — ED Triage Notes (Signed)
Pt presents today after having two teeth pulled on right lower side. Pt states she was not given antibiotics or pain medicine from dentist after procedure.

## 2020-01-11 ENCOUNTER — Ambulatory Visit: Payer: Medicaid Other

## 2020-02-24 ENCOUNTER — Other Ambulatory Visit: Payer: Self-pay

## 2020-02-24 ENCOUNTER — Encounter (HOSPITAL_COMMUNITY): Payer: Self-pay

## 2020-02-24 ENCOUNTER — Ambulatory Visit (HOSPITAL_COMMUNITY)
Admission: EM | Admit: 2020-02-24 | Discharge: 2020-02-24 | Disposition: A | Discharge status: Home / Self Care | Payer: Medicaid Other | Attending: Urgent Care | Admitting: Urgent Care

## 2020-02-24 DIAGNOSIS — J4520 Mild intermittent asthma, uncomplicated: Secondary | ICD-10-CM | POA: Diagnosis not present

## 2020-02-24 DIAGNOSIS — J3489 Other specified disorders of nose and nasal sinuses: Secondary | ICD-10-CM

## 2020-02-24 DIAGNOSIS — J029 Acute pharyngitis, unspecified: Secondary | ICD-10-CM | POA: Insufficient documentation

## 2020-02-24 DIAGNOSIS — J069 Acute upper respiratory infection, unspecified: Secondary | ICD-10-CM | POA: Diagnosis not present

## 2020-02-24 DIAGNOSIS — Z20822 Contact with and (suspected) exposure to covid-19: Secondary | ICD-10-CM | POA: Diagnosis not present

## 2020-02-24 LAB — SARS CORONAVIRUS 2 (TAT 6-24 HRS): SARS Coronavirus 2: NEGATIVE

## 2020-02-24 MED ORDER — CETIRIZINE HCL 10 MG PO TABS: 10.0000 mg | ORAL_TABLET | Freq: Every day | ORAL | 0 refills | Status: AC

## 2020-02-24 MED ORDER — PSEUDOEPHEDRINE HCL 30 MG PO TABS: 30.0000 mg | ORAL_TABLET | Freq: Three times a day (TID) | ORAL | 0 refills | Status: DC | PRN

## 2020-02-24 MED ORDER — ALBUTEROL SULFATE HFA 108 (90 BASE) MCG/ACT IN AERS: 1.0000 | INHALATION_SPRAY | Freq: Four times a day (QID) | RESPIRATORY_TRACT | 0 refills | Status: AC | PRN

## 2020-02-24 NOTE — ED Provider Notes (Signed)
[facility]   MRN: [id number] DOB: March 02, 1976  Subjective:   Kelsey Hunter is a 44 y.o. female presenting for 2 day hx of acute onset throat pain, sinus drainage.  Had 2 sick contacts, but tested negative for COVID-19.  Has a history of asthma, does not need an inhaler very frequently at all.  Needs a refill on this.  She is also a smoker.  No current facility-administered medications for this encounter.  Current Outpatient Medications:  .  ACCU-CHEK FASTCLIX LANCETS MISC, 1 Device by Percutaneous route 4 (four) times daily. (Patient not taking: Reported on 02/11/2019), Disp: 100 each, Rfl: 12 .  acetaminophen (TYLENOL) 325 MG tablet, Take 2 tablets (650 mg total) by mouth every 4 (four) hours as needed for mild pain. (Patient not taking: Reported on 02/11/2019), Disp: 30 tablet, Rfl: 0 .  amLODipine (NORVASC) 5 MG tablet, Take 1 tablet (5 mg total) by mouth daily., Disp: 30 tablet, Rfl: 11 .  cyclobenzaprine (FLEXERIL) 10 MG tablet, Take 1 tablet (10 mg total) by mouth 2 (two) times daily as needed for muscle spasms. (Patient not taking: Reported on 02/11/2019), Disp: 20 tablet, Rfl: 0 .  fluticasone (FLONASE) 50 MCG/ACT nasal spray, Place 2 sprays into both nostrils daily. (Patient not taking: Reported on 02/11/2019), Disp: 1 g, Rfl: 0 .  glucose blood (ACCU-CHEK GUIDE) test strip, Use as instructed QID (Patient not taking: Reported on 02/11/2019), Disp: 100 each, Rfl: 12 .  ibuprofen (ADVIL) 800 MG tablet, Take 1 tablet (800 mg total) by mouth every 8 (eight) hours as needed. (Patient not taking: Reported on 02/11/2019), Disp: 30 tablet, Rfl: 0 .  ipratropium (ATROVENT) 0.06 % nasal spray, Place 2 sprays into both nostrils 4 (four) times daily. (Patient not taking: Reported on 02/11/2019), Disp: 15 mL, Rfl: 0 .  metroNIDAZOLE (FLAGYL) 500 MG tablet, Take 1 tablet (500 mg total) by mouth 2 (two) times daily. (Patient not taking: Reported on 02/11/2019), Disp: 14 tablet, Rfl: 0 .   Prenatal Vit-Fe Fumarate-FA (PREPLUS) 27-1 MG TABS, Take 1 tablet by mouth daily. (Patient not taking: Reported on 02/11/2019), Disp: 30 tablet, Rfl: 13 .  traMADol (ULTRAM) 50 MG tablet, Take 1 tablet (50 mg total) by mouth every 6 (six) hours as needed., Disp: 15 tablet, Rfl: 0   Allergies  Allergen Reactions  . Tylenol [Acetaminophen] Rash    Past Medical History:  Diagnosis Date  . Asthma    inhaler when needed  . Hypertension      Past Surgical History:  Procedure Laterality Date  . CHOLECYSTECTOMY  03/2000  . TUBAL LIGATION Bilateral 10/09/2017   Procedure: POST PARTUM TUBAL LIGATION;  Surgeon: [place] Bing, MD;  Location: [facility] BIRTHING SUITES;  Service: Gynecology;  Laterality: Bilateral;    Family History  Problem Relation Age of Onset  . Kidney disease Father   . Heart disease Father     Social History   Tobacco Use  . Smoking status: Never Smoker  . Smokeless tobacco: Never Used  Substance Use Topics  . Alcohol use: No  . Drug use: No    ROS   Objective:   Vitals: BP 123/90 (BP Location: Right Arm)   Pulse 98   Temp 98 F (36.7 C) (Oral)   Resp 20   SpO2 98%   Physical Exam Constitutional:      General: She is not in acute distress.    Appearance: Normal appearance. She is well-developed. She is obese. She is not ill-appearing,  toxic-appearing or diaphoretic.  HENT:     Head: Normocephalic and atraumatic.     Right Ear: External ear normal.     Left Ear: External ear normal.     Nose: Nose normal.     Mouth/Throat:     Mouth: Mucous membranes are moist.     Pharynx: No pharyngeal swelling, oropharyngeal exudate, posterior oropharyngeal erythema or uvula swelling.     Tonsils: No tonsillar exudate or tonsillar abscesses.  Eyes:     Extraocular Movements: Extraocular movements intact.     Pupils: Pupils are equal, round, and reactive to light.  Cardiovascular:     Rate and Rhythm: Normal rate and regular rhythm.     Pulses: Normal pulses.       Heart sounds: Normal heart sounds. No murmur heard.  No friction rub. No gallop.   Pulmonary:     Effort: Pulmonary effort is normal. No respiratory distress.     Breath sounds: Normal breath sounds. No stridor. No wheezing, rhonchi or rales.  Skin:    General: Skin is warm and dry.     Findings: No rash.  Neurological:     Mental Status: She is alert and oriented to person, place, and time.  Psychiatric:        Mood and Affect: Mood normal.        Behavior: Behavior normal.        Thought Content: Thought content normal.       Assessment and Plan :   PDMP not reviewed this encounter.  1. Viral upper respiratory tract infection   2. Sore throat   3. Sinus drainage   4. Mild intermittent asthma without complication     Albuterol inhaler refill.  Will manage for viral illness such as viral URI, viral syndrome, viral rhinitis, COVID-19. Counseled patient on nature of COVID-19 including modes of transmission, diagnostic testing, management and supportive care.  Offered scripts for symptomatic relief. COVID 19 testing is pending. Counseled patient on potential for adverse effects with medications prescribed/recommended today, ER and return-to-clinic precautions discussed, patient verbalized understanding.     Kelsey Bamberg, PA-C 02/24/20 1622

## 2020-02-24 NOTE — Discharge Instructions (Addendum)

## 2020-02-24 NOTE — ED Triage Notes (Signed)
Pt presents with sore throat and nasal drainage X 2 days.

## 2020-06-07 ENCOUNTER — Other Ambulatory Visit: Payer: Self-pay

## 2020-06-07 ENCOUNTER — Other Ambulatory Visit (HOSPITAL_COMMUNITY)
Admission: RE | Admit: 2020-06-07 | Discharge: 2020-06-07 | Disposition: A | Discharge status: Home / Self Care | Payer: Medicaid Other | Source: Ambulatory Visit | Attending: Family Medicine | Admitting: Family Medicine

## 2020-06-07 ENCOUNTER — Ambulatory Visit (INDEPENDENT_AMBULATORY_CARE_PROVIDER_SITE_OTHER): Payer: Medicaid Other | Admitting: General Practice

## 2020-06-07 ENCOUNTER — Encounter: Payer: Self-pay | Admitting: General Practice

## 2020-06-07 VITALS — BP 135/72 | HR 78 | Ht 61.0 in | Wt 250.4 lb

## 2020-06-07 DIAGNOSIS — N898 Other specified noninflammatory disorders of vagina: Secondary | ICD-10-CM | POA: Diagnosis not present

## 2020-06-07 MED ORDER — METRONIDAZOLE 500 MG PO TABS: 500.0000 mg | ORAL_TABLET | Freq: Two times a day (BID) | ORAL | 0 refills | Status: DC

## 2020-06-07 NOTE — Progress Notes (Signed)
Pt here today for self swab. Pt states has thin, white vaginal discharge and fishy odor x 3 days. Pt states used new soap and thinks it is the cause of getting possible BV again. Pt advised will call in Rx for Flagyl 500 mg ID for 7 days, #14, 0RF to pharmacy on file per protocol.  Pt aware and will pick up today to begin.  Pt states still requests swab be sent for STD testing as well. Pt states is sexually active with husband who is uncircumcised.  Pt has hx of BV in past.  Pt due for Annual Exam and will schedule at front office today.   Pt not checking glucose any longer, states was only during pregnancy that she was diabetic.Denies any symptoms today.   Self swab collected and results advised to pt will be sent to Mychart in 24-48 hours and will be notified if needs further treatment. Pt verbalized understanding.   Judeth Cornfield, RN  06/07/20

## 2020-06-08 LAB — CERVICOVAGINAL ANCILLARY ONLY
Bacterial Vaginitis (gardnerella): NEGATIVE
Candida Glabrata: NEGATIVE
Candida Vaginitis: NEGATIVE
Chlamydia: NEGATIVE
Comment: NEGATIVE
Comment: NEGATIVE
Comment: NEGATIVE
Comment: NEGATIVE
Comment: NEGATIVE
Comment: NORMAL
Neisseria Gonorrhea: NEGATIVE
Trichomonas: NEGATIVE

## 2020-06-08 NOTE — Progress Notes (Signed)
Chart reviewed for nurse visit. Agree with plan of care.   Elecia Serafin Lorraine, CNM 06/08/2020 4:00 PM   

## 2020-06-13 ENCOUNTER — Other Ambulatory Visit: Payer: Self-pay | Admitting: Student

## 2020-07-18 DIAGNOSIS — M79604 Pain in right leg: Secondary | ICD-10-CM | POA: Insufficient documentation

## 2020-07-18 DIAGNOSIS — M170 Bilateral primary osteoarthritis of knee: Secondary | ICD-10-CM | POA: Insufficient documentation

## 2020-07-20 ENCOUNTER — Ambulatory Visit: Payer: Medicaid Other | Admitting: Student

## 2020-07-28 ENCOUNTER — Other Ambulatory Visit: Payer: Self-pay | Admitting: *Deleted

## 2020-07-28 MED ORDER — FLUCONAZOLE 150 MG PO TABS: 150.0000 mg | ORAL_TABLET | Freq: Once | ORAL | 0 refills | Status: AC

## 2020-07-28 NOTE — Progress Notes (Signed)
Fax request for fluconazole received from [facility] pharmacy on 2/10. Per chart review, pt was treated with metronidazole for BV on 06/07/20. Rx e-prescribed per standing order.

## 2020-08-28 ENCOUNTER — Ambulatory Visit: Payer: Medicaid Other | Admitting: Student

## 2020-09-29 ENCOUNTER — Encounter (HOSPITAL_COMMUNITY): Payer: Self-pay | Admitting: *Deleted

## 2020-09-29 ENCOUNTER — Emergency Department (HOSPITAL_COMMUNITY): Payer: Medicaid Other

## 2020-09-29 ENCOUNTER — Other Ambulatory Visit: Payer: Self-pay

## 2020-09-29 ENCOUNTER — Emergency Department (HOSPITAL_COMMUNITY)
Admission: EM | Admit: 2020-09-29 | Discharge: 2020-09-30 | Disposition: A | Discharge status: Home / Self Care | Payer: Medicaid Other | Attending: Emergency Medicine | Admitting: Emergency Medicine

## 2020-09-29 DIAGNOSIS — R079 Chest pain, unspecified: Secondary | ICD-10-CM | POA: Diagnosis not present

## 2020-09-29 DIAGNOSIS — R109 Unspecified abdominal pain: Secondary | ICD-10-CM | POA: Insufficient documentation

## 2020-09-29 DIAGNOSIS — Z20822 Contact with and (suspected) exposure to covid-19: Secondary | ICD-10-CM | POA: Diagnosis not present

## 2020-09-29 DIAGNOSIS — J45909 Unspecified asthma, uncomplicated: Secondary | ICD-10-CM | POA: Diagnosis not present

## 2020-09-29 DIAGNOSIS — R197 Diarrhea, unspecified: Secondary | ICD-10-CM | POA: Insufficient documentation

## 2020-09-29 DIAGNOSIS — E119 Type 2 diabetes mellitus without complications: Secondary | ICD-10-CM | POA: Insufficient documentation

## 2020-09-29 DIAGNOSIS — I10 Essential (primary) hypertension: Secondary | ICD-10-CM | POA: Insufficient documentation

## 2020-09-29 HISTORY — DX: Type 2 diabetes mellitus without complications: E11.9

## 2020-09-29 HISTORY — DX: Unspecified osteoarthritis, unspecified site: M19.90

## 2020-09-29 LAB — BASIC METABOLIC PANEL WITH GFR
Anion gap: 8 (ref 5–15)
BUN: 20 mg/dL (ref 6–20)
CO2: 23 mmol/L (ref 22–32)
Calcium: 9.1 mg/dL (ref 8.9–10.3)
Chloride: 104 mmol/L (ref 98–111)
Creatinine, Ser: 1.23 mg/dL — ABNORMAL HIGH (ref 0.44–1.00)
GFR, Estimated: 56 mL/min — ABNORMAL LOW
Glucose, Bld: 121 mg/dL — ABNORMAL HIGH (ref 70–99)
Potassium: 3.7 mmol/L (ref 3.5–5.1)
Sodium: 135 mmol/L (ref 135–145)

## 2020-09-29 LAB — RESP PANEL BY RT-PCR (FLU A&B, COVID) ARPGX2
Influenza A by PCR: NEGATIVE
Influenza B by PCR: NEGATIVE
SARS Coronavirus 2 by RT PCR: NEGATIVE

## 2020-09-29 LAB — CBC
HCT: 50.8 % — ABNORMAL HIGH (ref 36.0–46.0)
Hemoglobin: 16.4 g/dL — ABNORMAL HIGH (ref 12.0–15.0)
MCH: 28 pg (ref 26.0–34.0)
MCHC: 32.3 g/dL (ref 30.0–36.0)
MCV: 86.8 fL (ref 80.0–100.0)
Platelets: 275 10*3/uL (ref 150–400)
RBC: 5.85 MIL/uL — ABNORMAL HIGH (ref 3.87–5.11)
RDW: 13.7 % (ref 11.5–15.5)
WBC: 13.7 10*3/uL — ABNORMAL HIGH (ref 4.0–10.5)
nRBC: 0 % (ref 0.0–0.2)

## 2020-09-29 LAB — TROPONIN I (HIGH SENSITIVITY): Troponin I (High Sensitivity): 8 ng/L (ref <18)

## 2020-09-29 NOTE — ED Triage Notes (Signed)
Pt states chest pain and diarrhea today.  She just started taking metformin last night.  Pt also was a a funeral last week and one of the children tested positive for Covid.

## 2020-09-29 NOTE — ED Provider Notes (Signed)
Emergency Medicine Provider Triage Evaluation Note  Mattalyn Anderegg , a 45 y.o. female  was evaluated in triage.  Pt complains of chest congestion, body aches after exposure to someone who has COVID.  Also reports diarrhea after just starting metformin last night.  Review of Systems  Positive: Congestion, body aches, diarrhea Negative: Cough, fever  Physical Exam  BP 108/85 (BP Location: Right Wrist)   Pulse (!) 103   Temp 98.3 F (36.8 C)   Resp 20   Ht 5' (1.524 m)   Wt 112.5 kg   LMP 08/30/2020   SpO2 99%   BMI 48.43 kg/m  Gen:   Awake, no distress   Resp:  Normal effort  MSK:   Moves extremities without difficulty  Other:    Medical Decision Making  Medically screening exam initiated at 7:12 PM.  Appropriate orders placed.  Grizelda Piscopo was informed that the remainder of the evaluation will be completed by another provider, this initial triage assessment does not replace that evaluation, and the importance of remaining in the ED until their evaluation is complete.     Jeannie Fend, PA-C 09/29/20 Dessa Phi, MD 09/29/20 [phone]

## 2020-09-30 ENCOUNTER — Emergency Department (HOSPITAL_COMMUNITY): Payer: Medicaid Other

## 2020-09-30 LAB — URINALYSIS, ROUTINE W REFLEX MICROSCOPIC
Bilirubin Urine: NEGATIVE
Glucose, UA: NEGATIVE mg/dL
Hgb urine dipstick: NEGATIVE
Ketones, ur: NEGATIVE mg/dL
Leukocytes,Ua: NEGATIVE
Nitrite: NEGATIVE
Protein, ur: NEGATIVE mg/dL
Specific Gravity, Urine: 1.029 (ref 1.005–1.030)
pH: 5 (ref 5.0–8.0)

## 2020-09-30 LAB — TROPONIN I (HIGH SENSITIVITY): Troponin I (High Sensitivity): 6 ng/L (ref <18)

## 2020-09-30 MED ORDER — NAPROXEN 500 MG PO TABS: 500.0000 mg | ORAL_TABLET | Freq: Two times a day (BID) | ORAL | 0 refills | Status: DC

## 2020-09-30 MED ORDER — ONDANSETRON HCL 4 MG/2ML IJ SOLN
4.0000 mg | Freq: Once | INTRAMUSCULAR | Status: AC
  Administered 2020-09-30: 4 mg via INTRAVENOUS
  Filled 2020-09-30: qty 2

## 2020-09-30 MED ORDER — KETOROLAC TROMETHAMINE 30 MG/ML IJ SOLN
30.0000 mg | Freq: Once | INTRAMUSCULAR | Status: AC
  Administered 2020-09-30: 30 mg via INTRAVENOUS
  Filled 2020-09-30: qty 1

## 2020-09-30 MED ORDER — ACCU-CHEK SOFTCLIX LANCETS MISC: 1 refills | Status: AC

## 2020-09-30 MED ORDER — TRAMADOL HCL 50 MG PO TABS: 50.0000 mg | ORAL_TABLET | Freq: Four times a day (QID) | ORAL | 0 refills | Status: DC | PRN

## 2020-09-30 MED ORDER — SODIUM CHLORIDE 0.9 % IV BOLUS
1000.0000 mL | Freq: Once | INTRAVENOUS | Status: AC
  Administered 2020-09-30: 1000 mL via INTRAVENOUS

## 2020-09-30 NOTE — Discharge Instructions (Addendum)
Begin taking naproxen as prescribed.  Begin taking tramadol as prescribed as needed for pain not relieved with naproxen.  Stop taking metformin and see if this helps with the discomfort.  Keep an eye on your blood sugars for the next several days while off of metformin, then follow-up with your primary doctor if not improving.

## 2020-09-30 NOTE — ED Provider Notes (Signed)
[facility] EMERGENCY DEPARTMENT Provider Note   CSN: [id number] Arrival date & time: 09/29/20  1841     History Chief Complaint  Patient presents with  . Chest Pain  . Diarrhea    Kelsey Hunter is a 45 y.o. female.  Patient is a 45 year old female with history of diabetes, hypertension, and asthma.  Patient presenting here for evaluation of flank pain.  Patient describes pain to the right flank radiating to the upper abdomen for the past 2 days.  She was started on metformin the day before the onset of her discomfort.  She denies any fevers or chills.  She denies any bloody or black stools.  She does describe some loose stool.  The history is provided by the patient.       Past Medical History:  Diagnosis Date  . Arthritis   . Asthma    inhaler when needed  . Diabetes mellitus without complication (HCC)   . Hypertension     Patient Active Problem List   Diagnosis Date Noted  . Pain of left calf 09/22/2018  . Pain of toe of left foot 09/22/2018  . Gestational diabetes mellitus, class A2 10/08/2017  . SVD (spontaneous vaginal delivery) 10/08/2017  . Gestational diabetes mellitus (GDM) in third trimester 08/07/2017  . Supervision of high risk pregnancy, antepartum 04/14/2017  . Chronic hypertension during pregnancy, antepartum 04/14/2017  . Advanced maternal age in multigravida 04/14/2017  . Encounter for supervision of pregnancy with grand mulitparity in second trimester, antepartum 04/14/2017  . Morbid obesity (HCC) 04/14/2017    Past Surgical History:  Procedure Laterality Date  . CHOLECYSTECTOMY  03/2000  . TUBAL LIGATION Bilateral 10/09/2017   Procedure: POST PARTUM TUBAL LIGATION;  Surgeon: Burna Bing, MD;  Location: [facility] BIRTHING SUITES;  Service: Gynecology;  Laterality: Bilateral;     OB History    Gravida  12   Para  7   Term  7   Preterm      AB  5   Living  7     SAB  5   IAB      Ectopic      Multiple  0    Live Births  7           Family History  Problem Relation Age of Onset  . Kidney disease Father   . Heart disease Father     Social History   Tobacco Use  . Smoking status: Never Smoker  . Smokeless tobacco: Never Used  Substance Use Topics  . Alcohol use: No  . Drug use: No    Home Medications Prior to Admission medications   Medication Sig Start Date End Date Taking? Authorizing Provider  albuterol (VENTOLIN HFA) 108 (90 Base) MCG/ACT inhaler Inhale 1-2 puffs into the lungs every 6 (six) hours as needed for wheezing or shortness of breath. 02/24/20   Wallis Bamberg, PA-C  amLODipine (NORVASC) 5 MG tablet Take 1 tablet (5 mg total) by mouth daily. Patient not taking: Reported on 06/07/2020 10/10/17 10/10/18  Garnette Gunner, MD  cetirizine (ZYRTEC ALLERGY) 10 MG tablet Take 1 tablet (10 mg total) by mouth daily. 02/24/20   Wallis Bamberg, PA-C  fluticasone (FLONASE) 50 MCG/ACT nasal spray Place 2 sprays into both nostrils daily. Patient not taking: No sig reported 01/28/18   Belinda Fisher, PA-C  ibuprofen (ADVIL) 800 MG tablet Take 1 tablet (800 mg total) by mouth every 8 (eight) hours as needed. 12/29/18   Wendee Beavers  H, NP  metroNIDAZOLE (FLAGYL) 500 MG tablet Take 1 tablet (500 mg total) by mouth 2 (two) times daily. 06/07/20   [place] Bing, MD    Allergies    Tylenol [acetaminophen]  Review of Systems   Review of Systems  All other systems reviewed and are negative.   Physical Exam Updated Vital Signs BP (!) 127/98 (BP Location: Right Arm)   Pulse 89   Temp 98 F (36.7 C) (Oral)   Resp 15   Ht 5' (1.524 m)   Wt 112.5 kg   LMP 08/30/2020   SpO2 95%   BMI 48.43 kg/m   Physical Exam Vitals and nursing note reviewed.  Constitutional:      General: She is not in acute distress.    Appearance: She is well-developed. She is not diaphoretic.  HENT:     Head: Normocephalic and atraumatic.  Cardiovascular:     Rate and Rhythm: Normal rate and regular rhythm.      Heart sounds: No murmur heard. No friction rub. No gallop.   Pulmonary:     Effort: Pulmonary effort is normal. No respiratory distress.     Breath sounds: Normal breath sounds. No wheezing.  Abdominal:     General: Bowel sounds are normal. There is no distension.     Palpations: Abdomen is soft.     Tenderness: There is abdominal tenderness.     Comments: There is mild right-sided CVA tenderness.  Musculoskeletal:        General: Normal range of motion.     Cervical back: Normal range of motion and neck supple.  Skin:    General: Skin is warm and dry.  Neurological:     Mental Status: She is alert and oriented to person, place, and time.     ED Results / Procedures / Treatments   Labs (all labs ordered are listed, but only abnormal results are displayed) Labs Reviewed  BASIC METABOLIC PANEL - Abnormal; Notable for the following components:      Result Value   Glucose, Bld 121 (*)    Creatinine, Ser 1.23 (*)    GFR, Estimated 56 (*)    All other components within normal limits  CBC - Abnormal; Notable for the following components:   WBC 13.7 (*)    RBC 5.85 (*)    Hemoglobin 16.4 (*)    HCT 50.8 (*)    All other components within normal limits  RESP PANEL BY RT-PCR (FLU A&B, COVID) ARPGX2  URINALYSIS, ROUTINE W REFLEX MICROSCOPIC  TROPONIN I (HIGH SENSITIVITY)  TROPONIN I (HIGH SENSITIVITY)    EKG None  Radiology DG Chest Port 1 View  Result Date: 09/29/2020 CLINICAL DATA:  Chest pain. EXAM: PORTABLE CHEST 1 VIEW COMPARISON:  January 20, 2020 FINDINGS: Mildly decreased lung volumes are seen without evidence of acute infiltrate, pleural effusion or pneumothorax. Mild bibasilar bronchovascular crowding is seen. The heart size and mediastinal contours are within normal limits. The visualized skeletal structures are unremarkable. IMPRESSION: No active cardiopulmonary disease. Electronically Signed   By: Aram Candela M.D.   On: 09/29/2020 20:34     Procedures Procedures   Medications Ordered in ED Medications  sodium chloride 0.9 % bolus 1,000 mL (has no administration in time range)  ondansetron (ZOFRAN) injection 4 mg (has no administration in time range)  ketorolac (TORADOL) 30 MG/ML injection 30 mg (has no administration in time range)    ED Course  I have reviewed the triage vital signs and the nursing  notes.  Pertinent labs & imaging results that were available during my care of the patient were reviewed by me and considered in my medical decision making (see chart for details).    MDM Rules/Calculators/A&P  CT scan and laboratory studies are basically unremarkable.  This does not appear to be a kidney stone or UTI.  She was recently started on metformin and it is possible this could be upsetting her stomach.  I will have her hold this.  She will also be given lancets for her to follow her blood sugars.  She is to return as needed if symptoms worsen or change.  Nothing today appears emergent.  Final Clinical Impression(s) / ED Diagnoses Final diagnoses:  None    Rx / DC Orders ED Discharge Orders    None       Geoffery Lyons, MD 10/01/20 [phone]

## 2021-03-07 ENCOUNTER — Other Ambulatory Visit: Payer: Self-pay | Admitting: Family Medicine

## 2021-03-07 DIAGNOSIS — M79661 Pain in right lower leg: Secondary | ICD-10-CM

## 2021-03-07 DIAGNOSIS — M79662 Pain in left lower leg: Secondary | ICD-10-CM

## 2021-03-28 ENCOUNTER — Inpatient Hospital Stay: Admission: RE | Admit: 2021-03-28 | Payer: Medicaid Other | Source: Ambulatory Visit

## 2021-03-30 ENCOUNTER — Ambulatory Visit
Admission: RE | Admit: 2021-03-30 | Discharge: 2021-03-30 | Disposition: A | Discharge status: Home / Self Care | Payer: Medicaid Other | Source: Ambulatory Visit | Attending: Family Medicine | Admitting: Family Medicine

## 2021-03-30 DIAGNOSIS — M79661 Pain in right lower leg: Secondary | ICD-10-CM

## 2021-03-30 DIAGNOSIS — M79662 Pain in left lower leg: Secondary | ICD-10-CM

## 2021-06-11 ENCOUNTER — Ambulatory Visit: Payer: Medicaid Other

## 2021-06-14 ENCOUNTER — Other Ambulatory Visit: Payer: Self-pay

## 2021-06-14 ENCOUNTER — Other Ambulatory Visit (HOSPITAL_COMMUNITY)
Admission: RE | Admit: 2021-06-14 | Discharge: 2021-06-14 | Disposition: A | Discharge status: Home / Self Care | Payer: Medicaid Other | Source: Ambulatory Visit | Attending: Family Medicine | Admitting: Family Medicine

## 2021-06-14 ENCOUNTER — Ambulatory Visit (INDEPENDENT_AMBULATORY_CARE_PROVIDER_SITE_OTHER): Payer: Medicaid Other

## 2021-06-14 VITALS — BP 152/117 | HR 85 | Ht 60.0 in | Wt 229.3 lb

## 2021-06-14 DIAGNOSIS — N898 Other specified noninflammatory disorders of vagina: Secondary | ICD-10-CM | POA: Diagnosis present

## 2021-06-14 NOTE — Progress Notes (Signed)
Pt here today for vaginal discharge and odor x 2 weeks. Pt unsure of STD exposure.  Pt denies any other symptoms. Pt agreeable to self swab today.  Self swab collected today. Pt advised results will take 24-48 hours and will see results in mychart and will be notified if needs further treatment. Pt verbalized understanding to date and time of appt.   BP elevated at today's visit. Pt states is taking BP med at night. Pt advised to have follow up with PCP and has next GYN appt with Dr Crissie Reese on 07/05/21. Pt aware.   Laney Pastor

## 2021-06-15 ENCOUNTER — Other Ambulatory Visit: Payer: Self-pay

## 2021-06-15 DIAGNOSIS — B9689 Other specified bacterial agents as the cause of diseases classified elsewhere: Secondary | ICD-10-CM

## 2021-06-15 LAB — CERVICOVAGINAL ANCILLARY ONLY
Bacterial Vaginitis (gardnerella): POSITIVE — AB
Candida Glabrata: NEGATIVE
Candida Vaginitis: NEGATIVE
Chlamydia: NEGATIVE
Comment: NEGATIVE
Comment: NEGATIVE
Comment: NEGATIVE
Comment: NEGATIVE
Comment: NEGATIVE
Comment: NORMAL
Neisseria Gonorrhea: NEGATIVE
Trichomonas: NEGATIVE

## 2021-06-15 MED ORDER — METRONIDAZOLE 0.75 % VA GEL: 1.0000 | Freq: Two times a day (BID) | VAGINAL | 0 refills | Status: DC

## 2021-06-22 ENCOUNTER — Telehealth: Payer: Self-pay | Admitting: Family Medicine

## 2021-06-22 DIAGNOSIS — B9689 Other specified bacterial agents as the cause of diseases classified elsewhere: Secondary | ICD-10-CM

## 2021-06-22 MED ORDER — METRONIDAZOLE 0.75 % VA GEL: 1.0000 | Freq: Two times a day (BID) | VAGINAL | 0 refills | Status: DC

## 2021-06-22 NOTE — Telephone Encounter (Signed)
Called by [facility] RN Patient needs metrogel sent to her preferred pharmacy

## 2021-08-03 ENCOUNTER — Ambulatory Visit: Payer: Medicaid Other | Admitting: Family Medicine

## 2021-08-15 ENCOUNTER — Other Ambulatory Visit (HOSPITAL_COMMUNITY)
Admission: RE | Admit: 2021-08-15 | Discharge: 2021-08-15 | Disposition: A | Discharge status: Home / Self Care | Payer: Medicaid Other | Source: Ambulatory Visit | Attending: Student | Admitting: Student

## 2021-08-15 ENCOUNTER — Ambulatory Visit (INDEPENDENT_AMBULATORY_CARE_PROVIDER_SITE_OTHER): Payer: Medicaid Other | Admitting: Family Medicine

## 2021-08-15 ENCOUNTER — Encounter: Payer: Self-pay | Admitting: Family Medicine

## 2021-08-15 ENCOUNTER — Other Ambulatory Visit: Payer: Self-pay | Admitting: Family Medicine

## 2021-08-15 VITALS — BP 143/96 | HR 82 | Ht 60.0 in | Wt 233.0 lb

## 2021-08-15 DIAGNOSIS — Z1231 Encounter for screening mammogram for malignant neoplasm of breast: Secondary | ICD-10-CM

## 2021-08-15 DIAGNOSIS — N898 Other specified noninflammatory disorders of vagina: Secondary | ICD-10-CM | POA: Diagnosis not present

## 2021-08-15 DIAGNOSIS — Z01419 Encounter for gynecological examination (general) (routine) without abnormal findings: Secondary | ICD-10-CM | POA: Diagnosis present

## 2021-08-15 NOTE — Progress Notes (Signed)
? ? ?GYNECOLOGY ANNUAL PREVENTATIVE CARE ENCOUNTER NOTE ? ?Subjective:  ? Kelsey Hunter is a 46 y.o. [id number] female here for a routine annual gynecologic exam.  Current complaints: vaginal discharge for a few days with foul order. Desires STI testing.  Denies abnormal vaginal bleeding, discharge, pelvic pain, problems with intercourse or other gynecologic concerns.  ?  ?Gynecologic History ?Patient's last menstrual period was 08/10/2021. ?Patient is sexually active  ?Contraception: tubal ligation ?Last Pap: 2020. Results were: normal ?Last mammogram: n/a. ? ?The pregnancy intention screening data noted above was reviewed. Potential methods of contraception were discussed. The patient elected to proceed with No data recorded. ? ? ?Obstetric History ?OB History  ?Gravida Para Term Preterm AB Living  ?12 7 7   5 7   ?SAB IAB Ectopic Multiple Live Births  ?5     0 7  ?  ?# Outcome Date GA Lbr Len/2nd Weight Sex Delivery Anes PTL Lv  ?12 Term 10/08/17 [redacted]w[redacted]d 02:26 / 00:15 7 lb 1.6 oz (3.221 kg) F Vag-Spont EPI  LIV  ?   Birth Comments: WNL   ?11 SAB 2017          ?10 SAB 2016          ?9 SAB 2014          ?8 SAB 11/2011          ?7 SAB 05/2011          ?6 Term 2011 [redacted]w[redacted]d  7 lb 12 oz (3.515 kg) F Vag-Spont None N LIV  ?5 Term 2010 [redacted]w[redacted]d  7 lb 6 oz (3.345 kg) F Vag-Spont None N LIV  ?4 Term 2004 [redacted]w[redacted]d  9 lb 7 oz (4.281 kg) F Vag-Spont EPI N LIV  ?3 Term 2001 [redacted]w[redacted]d  6 lb 12 oz (3.062 kg) F Vag-Spont None N LIV  ?2 Term 1994 [redacted]w[redacted]d  6 lb 6 oz (2.892 kg) F Vag-Spont None N LIV  ?1 Term 1992 [redacted]w[redacted]d  7 lb 7 oz (3.374 kg) F Vag-Spont  N LIV  ? ? ?Past Medical History:  ?Diagnosis Date  ? Arthritis   ? Asthma   ? inhaler when needed  ? Diabetes mellitus without complication ([place])   ? Hypertension   ? ? ?Past Surgical History:  ?Procedure Laterality Date  ? CHOLECYSTECTOMY  03/2000  ? TUBAL LIGATION Bilateral 10/09/2017  ? Procedure: POST PARTUM TUBAL LIGATION;  Surgeon: Aletha Halim, MD;  Location: [facility];  Service:  Gynecology;  Laterality: Bilateral;  ? ? ?Current Outpatient Medications on File Prior to Visit  ?Medication Sig Dispense Refill  ? Accu-Chek Softclix Lancets lancets Use as instructed 100 each 1  ? albuterol (VENTOLIN HFA) 108 (90 Base) MCG/ACT inhaler Inhale 1-2 puffs into the lungs every 6 (six) hours as needed for wheezing or shortness of breath. 18 g 0  ? atorvastatin (LIPITOR) 20 MG tablet Take 20 mg by mouth daily.    ? cetirizine (ZYRTEC ALLERGY) 10 MG tablet Take 1 tablet (10 mg total) by mouth daily. 30 tablet 0  ? fluticasone (FLONASE) 50 MCG/ACT nasal spray Place 2 sprays into both nostrils daily. 1 g 0  ? ibuprofen (ADVIL) 800 MG tablet Take 1 tablet (800 mg total) by mouth every 8 (eight) hours as needed. 30 tablet 0  ? losartan-hydrochlorothiazide (HYZAAR) 50-12.5 MG tablet Take 1 tablet by mouth daily.    ? meloxicam (MOBIC) 15 MG tablet Take 15 mg by mouth daily.    ? metroNIDAZOLE (METROGEL VAGINAL)  0.75 % vaginal gel Place 1 Applicatorful vaginally 2 (two) times daily. 70 g 0  ? pantoprazole (PROTONIX) 40 MG tablet Take 40 mg by mouth daily.    ? TRULICITY 1.5 MG/0.5ML SOPN Inject 1.5 mg into the skin once a week.    ? ?No current facility-administered medications on file prior to visit.  ? ? ?Allergies  ?Allergen Reactions  ? Tylenol [Acetaminophen] Rash  ? ? ?Social History  ? ?Socioeconomic History  ? Marital status: Single  ?  Spouse name: Not on file  ? Number of children: Not on file  ? Years of education: Not on file  ? Highest education level: Not on file  ?Occupational History  ? Not on file  ?Tobacco Use  ? Smoking status: Never  ? Smokeless tobacco: Never  ?Substance and Sexual Activity  ? Alcohol use: No  ? Drug use: No  ? Sexual activity: Yes  ?  Birth control/protection: Surgical  ?Other Topics Concern  ? Not on file  ?Social History Narrative  ? Not on file  ? ?Social Determinants of Health  ? ?Financial Resource Strain: Not on file  ?Food Insecurity: Not on file  ?Transportation  Needs: Not on file  ?Physical Activity: Not on file  ?Stress: Not on file  ?Social Connections: Not on file  ?Intimate Partner Violence: Not on file  ? ? ?Family History  ?Problem Relation Age of Onset  ? Kidney disease Father   ? Heart disease Father   ? ? ?The following portions of the patient's history were reviewed and updated as appropriate: allergies, current medications, past family history, past medical history, past social history, past surgical history and problem list. ? ?Review of Systems ?Pertinent items are noted in HPI. ?  ?Objective:  ?BP (!) 143/96   Pulse 82   Ht 5' (1.524 m)   Wt 233 lb (105.7 kg)   LMP 08/10/2021   BMI 45.50 kg/m?  ?Wt Readings from Last 3 Encounters:  ?08/15/21 233 lb (105.7 kg)  ?06/14/21 229 lb 4.8 oz (104 kg)  ?09/29/20 248 lb (112.5 kg)  ?  ? ?Chaperone present during exam ? ?CONSTITUTIONAL: Well-developed, well-nourished female in no acute distress.  ?HENT:  Normocephalic, atraumatic, External right and left ear normal. Oropharynx is clear and moist ?EYES: Conjunctivae and EOM are normal. Pupils are equal, round, and reactive to light. No scleral icterus.  ?NECK: Normal range of motion, supple, no masses.  Normal thyroid.  ? ?CARDIOVASCULAR: Normal heart rate noted, regular rhythm ?RESPIRATORY: Clear to auscultation bilaterally. Effort and breath sounds normal, no problems with respiration noted. ?BREASTS: Symmetric in size. No masses, skin changes, nipple drainage, or lymphadenopathy. ?ABDOMEN: Soft, normal bowel sounds, no distention noted.  No tenderness, rebound or guarding.  ?PELVIC: Normal appearing external genitalia; normal appearing vaginal mucosa and cervix.  Increased thin yellow discharge noted.  Normal uterine size, no other palpable masses, no uterine or adnexal tenderness. ?MUSCULOSKELETAL: Normal range of motion. No tenderness.  No cyanosis, clubbing, or edema.  2+ distal pulses. ?SKIN: Skin is warm and dry. No rash noted. Not diaphoretic. No erythema.  No pallor. ?NEUROLOGIC: Alert and oriented to person, place, and time. Normal reflexes, muscle tone coordination. No cranial nerve deficit noted. ?PSYCHIATRIC: Normal mood and affect. Normal behavior. Normal judgment and thought content. ? ?Assessment:  ?Annual gynecologic examination with pap smear ?  ?Plan:  ?1. Well Woman Exam ?Will follow up results of pap smear and manage accordingly. ?Mammogram scheduled ?STD testing discussed. Patient requested  testing ?- Cytology - PAP( [facility]) ?- HIV Antibody (routine testing w rflx) ?- RPR ?- Hepatitis B Surface AntiGEN ? ?2. Vaginal discharge ?- Cervicovaginal ancillary only( [facility]) ? ? ? ?Routine preventative health maintenance measures emphasized. ?Please refer to After Visit Summary for other counseling recommendations.  ? ? ?Loma Boston, DO ?[facility] ?

## 2021-08-16 LAB — HIV ANTIBODY (ROUTINE TESTING W REFLEX): HIV Screen 4th Generation wRfx: NONREACTIVE

## 2021-08-16 LAB — CERVICOVAGINAL ANCILLARY ONLY
Bacterial Vaginitis (gardnerella): POSITIVE — AB
Candida Glabrata: NEGATIVE
Candida Vaginitis: NEGATIVE
Chlamydia: NEGATIVE
Comment: NEGATIVE
Comment: NEGATIVE
Comment: NEGATIVE
Comment: NEGATIVE
Comment: NEGATIVE
Comment: NORMAL
Neisseria Gonorrhea: NEGATIVE
Trichomonas: NEGATIVE

## 2021-08-16 LAB — HEPATITIS B SURFACE ANTIGEN: Hepatitis B Surface Ag: NEGATIVE

## 2021-08-16 LAB — RPR: RPR Ser Ql: NONREACTIVE

## 2021-08-16 MED ORDER — METRONIDAZOLE 500 MG PO TABS: 500.0000 mg | ORAL_TABLET | Freq: Two times a day (BID) | ORAL | 0 refills | Status: DC

## 2021-08-16 NOTE — Addendum Note (Signed)
Addended by: Levie Heritage on: 08/16/2021 05:23 PM ? ? Modules accepted: Orders ? ?

## 2021-08-20 LAB — CYTOLOGY - PAP
Comment: NEGATIVE
Diagnosis: NEGATIVE
Diagnosis: REACTIVE
High risk HPV: NEGATIVE

## 2021-08-30 ENCOUNTER — Ambulatory Visit: Payer: Medicaid Other

## 2021-10-03 DIAGNOSIS — E119 Type 2 diabetes mellitus without complications: Secondary | ICD-10-CM | POA: Insufficient documentation

## 2022-01-01 ENCOUNTER — Ambulatory Visit: Payer: Medicaid Other

## 2022-02-06 ENCOUNTER — Other Ambulatory Visit (HOSPITAL_COMMUNITY)
Admission: RE | Admit: 2022-02-06 | Discharge: 2022-02-06 | Disposition: A | Discharge status: Home / Self Care | Payer: Medicaid Other | Source: Ambulatory Visit | Attending: Family Medicine | Admitting: Family Medicine

## 2022-02-06 ENCOUNTER — Ambulatory Visit (INDEPENDENT_AMBULATORY_CARE_PROVIDER_SITE_OTHER): Payer: Medicaid Other

## 2022-02-06 ENCOUNTER — Other Ambulatory Visit: Payer: Self-pay

## 2022-02-06 VITALS — BP 130/91 | HR 88

## 2022-02-06 DIAGNOSIS — R300 Dysuria: Secondary | ICD-10-CM

## 2022-02-06 DIAGNOSIS — N898 Other specified noninflammatory disorders of vagina: Secondary | ICD-10-CM | POA: Insufficient documentation

## 2022-02-06 DIAGNOSIS — N3941 Urge incontinence: Secondary | ICD-10-CM

## 2022-02-06 DIAGNOSIS — R339 Retention of urine, unspecified: Secondary | ICD-10-CM

## 2022-02-06 DIAGNOSIS — K219 Gastro-esophageal reflux disease without esophagitis: Secondary | ICD-10-CM | POA: Insufficient documentation

## 2022-02-06 MED ORDER — NITROFURANTOIN MONOHYD MACRO 100 MG PO CAPS: 100.0000 mg | ORAL_CAPSULE | Freq: Two times a day (BID) | ORAL | 0 refills | Status: DC

## 2022-02-06 MED ORDER — METRONIDAZOLE 500 MG PO TABS: 500.0000 mg | ORAL_TABLET | Freq: Two times a day (BID) | ORAL | 0 refills | Status: DC

## 2022-02-06 NOTE — Progress Notes (Signed)
Here today with complaint of vaginal discharge with fishy odor and incomplete emptying of bladder; pt reports "have to use extra pressure." States for past week she has felt pain when urinating, states this has improved but is still present. Also reports incontinence; describes feeling urge to urinate and not being able to make it to the toilet before urinating. Urinalysis shows trace leukocytes. Pt desires treatment today. Macrobid ordered per protocol. Explained urine will be sent for culture and we will notify her if additional medication is needed.   Self swab specimen obtained to test for vaginal infection. Will treat for BV per protocol while waiting for results.  Apolonio Schneiders RN 02/06/22

## 2022-02-07 LAB — POCT URINALYSIS DIP (DEVICE)
Bilirubin Urine: NEGATIVE
Glucose, UA: NEGATIVE mg/dL
Hgb urine dipstick: NEGATIVE
Ketones, ur: NEGATIVE mg/dL
Nitrite: NEGATIVE
Protein, ur: NEGATIVE mg/dL
Specific Gravity, Urine: 1.025 (ref 1.005–1.030)
Urobilinogen, UA: 0.2 mg/dL (ref 0.0–1.0)
pH: 5.5 (ref 5.0–8.0)

## 2022-02-08 LAB — CERVICOVAGINAL ANCILLARY ONLY
Bacterial Vaginitis (gardnerella): NEGATIVE
Candida Glabrata: NEGATIVE
Candida Vaginitis: NEGATIVE
Chlamydia: NEGATIVE
Comment: NEGATIVE
Comment: NEGATIVE
Comment: NEGATIVE
Comment: NEGATIVE
Comment: NEGATIVE
Comment: NORMAL
Neisseria Gonorrhea: NEGATIVE
Trichomonas: NEGATIVE

## 2022-02-09 LAB — URINE CULTURE

## 2022-05-01 ENCOUNTER — Other Ambulatory Visit: Payer: Self-pay | Admitting: Family Medicine

## 2022-05-01 DIAGNOSIS — N898 Other specified noninflammatory disorders of vagina: Secondary | ICD-10-CM

## 2022-05-15 ENCOUNTER — Other Ambulatory Visit: Payer: Self-pay | Admitting: Family Medicine

## 2022-05-15 DIAGNOSIS — Z1231 Encounter for screening mammogram for malignant neoplasm of breast: Secondary | ICD-10-CM

## 2022-05-20 ENCOUNTER — Ambulatory Visit: Payer: Medicaid Other

## 2022-05-21 ENCOUNTER — Ambulatory Visit: Payer: Self-pay

## 2022-05-21 ENCOUNTER — Ambulatory Visit (INDEPENDENT_AMBULATORY_CARE_PROVIDER_SITE_OTHER): Payer: Medicaid Other | Admitting: Physician Assistant

## 2022-05-21 ENCOUNTER — Ambulatory Visit (INDEPENDENT_AMBULATORY_CARE_PROVIDER_SITE_OTHER): Payer: Medicaid Other

## 2022-05-21 DIAGNOSIS — M25561 Pain in right knee: Secondary | ICD-10-CM | POA: Diagnosis not present

## 2022-05-21 DIAGNOSIS — M25552 Pain in left hip: Secondary | ICD-10-CM | POA: Diagnosis not present

## 2022-05-21 DIAGNOSIS — M2550 Pain in unspecified joint: Secondary | ICD-10-CM

## 2022-05-21 DIAGNOSIS — M25562 Pain in left knee: Secondary | ICD-10-CM

## 2022-05-21 DIAGNOSIS — G8929 Other chronic pain: Secondary | ICD-10-CM

## 2022-05-21 MED ORDER — METHYLPREDNISOLONE 4 MG PO TBPK: ORAL_TABLET | ORAL | 0 refills | Status: DC

## 2022-05-21 NOTE — Progress Notes (Signed)
Office Visit Note   Patient: Kelsey Hunter           Date of Birth: 1976-03-07           MRN: [id number] Visit Date: 05/21/2022              Requested by: Medicine, [facility] [place],  [place] [id number] PCP: Jorge Ny, PA-C  Chief Complaint  Patient presents with  . Right Knee - Pain  . Left Knee - Pain  . Left Hip - Pain  . Right Hip - Pain      HPI: The patient is a pleasant 47 year old woman who comes in for bilateral hip bilateral knee pain and complaints of "my whole body just hurts ".  She denies any injuries.  Previous treatment in the past has been steroid injections into her knees which used to work but no longer work.  Oral anti-inflammatories including ibuprofen and meloxicam.  She says she feels best when she soaks in a hot Epsom salt bath.  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  Sometimes her pain is episodic.  She does have a family history in 2 grandparents of rheumatoid arthritis.  When she lived elsewhere she was told she had rheumatoid arthritis but states that when she moved here she was told by another provider that she did not.  Assessment & Plan: Visit Diagnoses:  1. Polyarthralgia   2. Pain in left hip   3. Chronic pain of right knee   4. Chronic pain of left knee     Plan: Polyarthralgia.  Her current BMI is 45.  She has failed conservative treatment including anti-inflammatories orally and injections into her knees.  X-rays do demonstrate degenerative changes of her left greater than right hip and some mild to moderate changes in her knees.  I do have concerns giving her stiffness in her hands and her continuing loss of mobility to rule out an inflammatory arthropathy.  Will reorder inflammatory labs and give her referral to rheumatologist.  In the meantime I am going to try a short steroid taper to see if this helps her.  She understands to  take this with food and not take other anti-inflammatories with it.  She is certainly young to undergo joint replacements and her BMI needs to be lower.  She is working on this and has already lost 40 pounds  Follow-Up Instructions: No follow-ups on file.   Ortho Exam  Patient is alert, oriented, no adenopathy, well-dressed, normal affect, normal respiratory effort. Examination she has a very stiff gait.  Examination of her left hip she has decreased range of motion.  Rotation of the hip actually creates pain going down into her knee.  Left knee has fairly good motion.  She has global tenderness medially and laterally but no effusion no erythema. Right hip decreased motion though not as bad as the left.  She has good motion of the knee though is globally tender medially and laterally.  She is neurovascularly intact distally with good dorsiflexion plantarflexion extension and flexion of her legs.  Sensation is intact  Imaging: XR KNEE 3 VIEW LEFT  Result Date: 05/21/2022 Three-view radiographs of the left knee demonstrate overall some joint space narrowing but no significant osteophyte formation.  Slight medial greater than lateral narrowing.  No acute fractures or other osseous changes  XR Pelvis 1-2 Views  Result Date: 05/21/2022 AP pelvis was taken today.  There is significant joint space space narrowing left greater than right.  No acute fractures noted.  XR KNEE 3 VIEW RIGHT  Result Date: 05/21/2022 Three-view radiographs of her right knee were reviewed today.  Demonstrates overall well-maintained alignment some medial side joint space narrowing no acute fractures noted.  Does have some small periarticular osteophytes  No images are attached to the encounter.  Labs: Lab Results  Component Value Date   HGBA1C 6.0 (H) 09/05/2017   HGBA1C 5.8 (H) 04/14/2017     Lab Results  Component Value Date   ALBUMIN 2.6 (L) 10/08/2017   ALBUMIN 3.3 (L) 09/15/2017   ALBUMIN 3.7 04/14/2017     No results found for: "MG" No results found for: "VD25OH"  No results found for: "PREALBUMIN"    Latest Ref Rng & Units 09/29/2020    7:15 PM 10/08/2017    2:24 PM 10/08/2017    8:17 AM  CBC EXTENDED  WBC 4.0 - 10.5 K/uL 13.7  8.3  8.4   RBC 3.87 - 5.11 MIL/uL 5.85  4.24  4.10   Hemoglobin 12.0 - 15.0 g/dL 35.5  73.2  20.2   HCT 36.0 - 46.0 % 50.8  36.5  35.3   Platelets 150 - 400 K/uL 275  161  160      There is no height or weight on file to calculate BMI.  Orders:  Orders Placed This Encounter  Procedures  . XR Pelvis 1-2 Views  . XR KNEE 3 VIEW RIGHT  . XR KNEE 3 VIEW LEFT  . Rheumatoid Factor  . Antinuclear Antib (ANA)  . Sed Rate (ESR)  . C-reactive protein  . CBC with Differential  . Ambulatory referral to Rheumatology  . Ambulatory referral to Physical Therapy   Meds ordered this encounter  Medications  . methylPREDNISolone (MEDROL DOSEPAK) 4 MG TBPK tablet    Sig: Take as directed with fdood    Dispense:  21 tablet    Refill:  0     Procedures: No procedures performed  Clinical Data: No additional findings.  ROS:  All other systems negative, except as noted in the HPI. Review of Systems  All other systems reviewed and are negative.  Objective: Vital Signs: There were no vitals taken for this visit.  Specialty Comments:  No specialty comments available.  PMFS History: Patient Active Problem List   Diagnosis Date Noted  . GERD (gastroesophageal reflux disease) 02/06/2022  . Diabetes mellitus ([facility]) 10/03/2021  . Leg pain, bilateral 07/18/2020  . Primary osteoarthritis of both knees 07/18/2020  . Hyperlipidemia 10/08/2019  . Chronic hypertension 04/14/2017   Past Medical History:  Diagnosis Date  . Arthritis   . Asthma    inhaler when needed  . Diabetes mellitus without complication (HCC)   . Gestational diabetes mellitus (GDM) in third trimester 08/07/2017   Current Diabetic Medications:  None  [ ]  Aspirin 81 mg daily after 12  weeks; discontinue after 36 weeks (? A2/B GDM)  Required Referrals for A1GDM or A2GDM: [X]  Diabetes Education and Testing Supplies [ ]  Nutrition Cousult  For A2/B GDM or higher classes of DM [ ]  Diabetes Education and Testing Supplies [ ]  Nutrition Counsult [ ]  Fetal ECHO after 20 weeks  [ ]  Eye exam for retina evaluation   Base  . Hypertension     Family History  Problem Relation Age of Onset  . Kidney disease Father   . Heart disease Father  Past Surgical History:  Procedure Laterality Date  . CHOLECYSTECTOMY  03/2000  . TUBAL LIGATION Bilateral 10/09/2017   Procedure: POST PARTUM TUBAL LIGATION;  Surgeon: [place] Bing, MD;  Location: [facility];  Service: Gynecology;  Laterality: Bilateral;   Social History   Occupational History  . Not on file  Tobacco Use  . Smoking status: Never  . Smokeless tobacco: Never  Substance and Sexual Activity  . Alcohol use: No  . Drug use: No  . Sexual activity: Yes    Birth control/protection: Surgical

## 2022-05-22 ENCOUNTER — Ambulatory Visit: Payer: Medicaid Other

## 2022-05-23 LAB — CBC WITH DIFFERENTIAL/PLATELET
Absolute Monocytes: 320 cells/uL (ref 200–950)
Basophils Absolute: 28 cells/uL (ref 0–200)
Basophils Relative: 0.4 %
Eosinophils Absolute: 50 cells/uL (ref 15–500)
Eosinophils Relative: 0.7 %
HCT: 39.4 % (ref 35.0–45.0)
Hemoglobin: 13.3 g/dL (ref 11.7–15.5)
Lymphs Abs: 2102 cells/uL (ref 850–3900)
MCH: 28.4 pg (ref 27.0–33.0)
MCHC: 33.8 g/dL (ref 32.0–36.0)
MCV: 84.2 fL (ref 80.0–100.0)
MPV: 11.3 fL (ref 7.5–12.5)
Monocytes Relative: 4.5 %
Neutro Abs: 4601 cells/uL (ref 1500–7800)
Neutrophils Relative %: 64.8 %
Platelets: 232 10*3/uL (ref 140–400)
RBC: 4.68 10*6/uL (ref 3.80–5.10)
RDW: 13.1 % (ref 11.0–15.0)
Total Lymphocyte: 29.6 %
WBC: 7.1 10*3/uL (ref 3.8–10.8)

## 2022-05-23 LAB — ANA: Anti Nuclear Antibody (ANA): POSITIVE — AB

## 2022-05-23 LAB — ANTI-NUCLEAR AB-TITER (ANA TITER)
ANA TITER: 1:40 {titer} — ABNORMAL HIGH
ANA Titer 1: 1:80 {titer} — ABNORMAL HIGH

## 2022-05-23 LAB — RHEUMATOID FACTOR: Rheumatoid fact SerPl-aCnc: <14 IU/mL (ref <14)

## 2022-05-23 LAB — SEDIMENTATION RATE: Sed Rate: 17 mm/h (ref 0–20)

## 2022-05-23 LAB — C-REACTIVE PROTEIN: CRP: 17.4 mg/L — ABNORMAL HIGH (ref <8.0)

## 2022-05-24 ENCOUNTER — Ambulatory Visit
Admission: RE | Admit: 2022-05-24 | Discharge: 2022-05-24 | Disposition: A | Discharge status: Home / Self Care | Payer: Medicaid Other | Source: Ambulatory Visit | Attending: Family Medicine | Admitting: Family Medicine

## 2022-05-24 DIAGNOSIS — Z1231 Encounter for screening mammogram for malignant neoplasm of breast: Secondary | ICD-10-CM

## 2022-05-28 ENCOUNTER — Other Ambulatory Visit: Payer: Self-pay | Admitting: Physician Assistant

## 2022-05-28 DIAGNOSIS — M2550 Pain in unspecified joint: Secondary | ICD-10-CM

## 2022-05-28 MED ORDER — MELOXICAM 15 MG PO TABS: 15.0000 mg | ORAL_TABLET | Freq: Every day | ORAL | 0 refills | Status: DC

## 2022-06-02 NOTE — Therapy (Deleted)
OUTPATIENT PHYSICAL THERAPY LOWER EXTREMITY EVALUATION   Patient Name: Kelsey Hunter MRN: [id number] DOB:31-Mar-1976, 47 y.o., female Today's Date: 06/02/2022  END OF SESSION:   Past Medical History:  Diagnosis Date   Arthritis    Asthma    inhaler when needed   Diabetes mellitus without complication ([place])    Gestational diabetes mellitus (GDM) in third trimester 08/07/2017   Current Diabetic Medications:  None  [ ]$  Aspirin 81 mg daily after 12 weeks; discontinue after 36 weeks (? A2/B GDM)  Required Referrals for A1GDM or A2GDM: [X]$  Diabetes Education and Testing Supplies [ ]$  Nutrition Cousult  For A2/B GDM or higher classes of DM [ ]$  Diabetes Education and Testing Supplies [ ]$  Nutrition Counsult [ ]$  Fetal ECHO after 20 weeks  [ ]$  Eye exam for retina evaluation   Base   Hypertension    Past Surgical History:  Procedure Laterality Date   CHOLECYSTECTOMY  03/2000   TUBAL LIGATION Bilateral 10/09/2017   Procedure: POST PARTUM TUBAL LIGATION;  Surgeon: Aletha Halim, MD;  Location: [facility];  Service: Gynecology;  Laterality: Bilateral;   Patient Active Problem List   Diagnosis Date Noted   GERD (gastroesophageal reflux disease) 02/06/2022   Diabetes mellitus ([place]) 10/03/2021   Leg pain, bilateral 07/18/2020   Primary osteoarthritis of both knees 07/18/2020   Hyperlipidemia 10/08/2019   Chronic hypertension 04/14/2017    PCP: Jorge Ny PA-C  REFERRING PROVIDER: Persons, Madaline Savage PA   REFERRING DIAG:  [phone] (ICD-10-CM) - Pain in left hip  M25.561,G89.29 (ICD-10-CM) - Chronic pain of right knee  M25.562,G89.29 (ICD-10-CM) - Chronic pain of left knee    THERAPY DIAG:  No diagnosis found.  Rationale for Evaluation and Treatment: Rehabilitation  ONSET DATE: ***  SUBJECTIVE:   SUBJECTIVE STATEMENT: ***  PERTINENT HISTORY: The patient is a pleasant 48 year old woman who comes in for bilateral hip bilateral knee pain and complaints of "my whole body  just hurts ".  She denies any injuries.  Previous treatment in the past has been steroid injections into her knees which used to work but no longer work.  Oral anti-inflammatories including ibuprofen and meloxicam.  She says she feels best when she soaks in a hot Epsom salt bath.  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  Sometimes her pain is episodic.  She does have a family history in 2 grandparents of rheumatoid arthritis.  When she lived elsewhere she was told she had rheumatoid arthritis but states that when she moved here she was told by another provider that she did not.   PAIN:  Are you having pain? Yes: NPRS scale: ***/10 Pain location: *** Pain description: *** Aggravating factors: *** Relieving factors: ***  PRECAUTIONS: {Therapy precautions:24002}  WEIGHT BEARING RESTRICTIONS: {Yes ***/No:24003}  FALLS:  Has patient fallen in last 6 months? {fallsyesno:27318}  LIVING ENVIRONMENT: Lives with: {OPRC lives with:25569::"lives with their family"} Lives in: {Lives in:25570} Stairs: {opstairs:27293} Has following equipment at home: {Assistive devices:23999}  OCCUPATION: ***  PLOF: {PLOF:24004}  PATIENT GOALS: ***  NEXT MD VISIT:   OBJECTIVE:   DIAGNOSTIC FINDINGS: Recent blood test  PATIENT SURVEYS:  LEFS ***  COGNITION: Overall cognitive status: Within functional limits for tasks assessed     SENSATION: WFL  EDEMA:  {edema:24020}  MUSCLE LENGTH: Hamstrings: Right *** deg; Left *** deg Thomas test: Right *** deg; Left *** deg  POSTURE: {posture:25561}  PALPATION: ***  LOWER EXTREMITY ROM:  Active ROM Right eval Left eval  Hip flexion    Hip extension    Hip abduction    Hip adduction    Hip internal rotation    Hip external rotation    Knee flexion    Knee extension    Ankle dorsiflexion    Ankle plantarflexion    Ankle inversion    Ankle eversion     (Blank  rows = not tested)  LOWER EXTREMITY MMT:  MMT Right eval Left eval  Hip flexion    Hip extension    Hip abduction    Hip adduction    Hip internal rotation    Hip external rotation    Knee flexion    Knee extension    Ankle dorsiflexion    Ankle plantarflexion    Ankle inversion    Ankle eversion     (Blank rows = not tested)  LOWER EXTREMITY SPECIAL TESTS:  {LEspecialtests:26242}  FUNCTIONAL TESTS:  {Functional tests:24029}  GAIT: Distance walked: *** Assistive device utilized: {Assistive devices:23999} Level of assistance: {Levels of assistance:24026} Comments: ***   TODAY'S TREATMENT:                                                                                                                              DATE: 06/03/22    PATIENT EDUCATION:  Education details: PT/POC, HEP  Person educated: {Person educated:25204} Education method: {Education Method:25205} Education comprehension: {Education Comprehension:25206}  HOME EXERCISE PROGRAM: ***  ASSESSMENT:  CLINICAL IMPRESSION: Patient is a 47 y.o. female  who was seen today for physical therapy evaluation and treatment for polyarthralgia.   OBJECTIVE IMPAIRMENTS: {opptimpairments:25111}.   ACTIVITY LIMITATIONS: {activitylimitations:27494}  PARTICIPATION LIMITATIONS: {participationrestrictions:25113}  PERSONAL FACTORS: {Personal factors:25162} are also affecting patient's functional outcome.   REHAB POTENTIAL: {rehabpotential:25112}  CLINICAL DECISION MAKING: {clinical decision making:25114}  EVALUATION COMPLEXITY: {Evaluation complexity:25115}   GOALS: Goals reviewed with patient? {yes/no:20286}  SHORT TERM GOALS: Target date: *** *** Baseline: Goal status: {GOALSTATUS:25110}  2.  *** Baseline:  Goal status: {GOALSTATUS:25110}  3.  *** Baseline:  Goal status: {GOALSTATUS:25110}  4.  *** Baseline:  Goal status: {GOALSTATUS:25110}  5.  *** Baseline:  Goal status:  {GOALSTATUS:25110}  6.  *** Baseline:  Goal status: {GOALSTATUS:25110}  LONG TERM GOALS: Target date: ***  *** Baseline:  Goal status: {GOALSTATUS:25110}  2.  *** Baseline:  Goal status: {GOALSTATUS:25110}  3.  *** Baseline:  Goal status: {GOALSTATUS:25110}  4.  *** Baseline:  Goal status: {GOALSTATUS:25110}  5.  *** Baseline:  Goal status: {GOALSTATUS:25110}  6.  *** Baseline:  Goal status: {GOALSTATUS:25110}   PLAN:  PT FREQUENCY: {rehab frequency:25116}  PT DURATION: {rehab duration:25117}  PLANNED INTERVENTIONS: {rehab planned interventions:25118::"Therapeutic exercises","Therapeutic activity","Neuromuscular re-education","Balance training","Gait training","Patient/Family education","Self Care","Joint mobilization"}  PLAN FOR NEXT SESSION: ***   Margareth Kanner, PT 06/02/2022, 3:36 PM

## 2022-06-03 ENCOUNTER — Ambulatory Visit: Payer: Medicaid Other | Attending: Physician Assistant | Admitting: Physical Therapy

## 2022-06-08 ENCOUNTER — Ambulatory Visit: Payer: Medicaid Other | Admitting: Physical Therapy

## 2022-06-26 ENCOUNTER — Other Ambulatory Visit: Payer: Self-pay | Admitting: Physician Assistant

## 2022-06-27 ENCOUNTER — Ambulatory Visit: Payer: Commercial Managed Care - HMO | Attending: Physician Assistant | Admitting: Physical Therapy

## 2022-06-27 NOTE — Therapy (Incomplete)
OUTPATIENT PHYSICAL THERAPY LOWER EXTREMITY EVALUATION   Patient Name: Kelsey Hunter MRN: [id number] DOB:1975/11/24, 47 y.o., female Today's Date: 06/27/2022  END OF SESSION:   Past Medical History:  Diagnosis Date   Arthritis    Asthma    inhaler when needed   Diabetes mellitus without complication ([place])    Gestational diabetes mellitus (GDM) in third trimester 08/07/2017   Current Diabetic Medications:  None  [ ]$  Aspirin 81 mg daily after 12 weeks; discontinue after 36 weeks (? A2/B GDM)  Required Referrals for A1GDM or A2GDM: [X]$  Diabetes Education and Testing Supplies [ ]$  Nutrition Cousult  For A2/B GDM or higher classes of DM [ ]$  Diabetes Education and Testing Supplies [ ]$  Nutrition Counsult [ ]$  Fetal ECHO after 20 weeks  [ ]$  Eye exam for retina evaluation   Base   Hypertension    Past Surgical History:  Procedure Laterality Date   CHOLECYSTECTOMY  03/2000   TUBAL LIGATION Bilateral 10/09/2017   Procedure: POST PARTUM TUBAL LIGATION;  Surgeon: Aletha Halim, MD;  Location: [facility];  Service: Gynecology;  Laterality: Bilateral;   Patient Active Problem List   Diagnosis Date Noted   GERD (gastroesophageal reflux disease) 02/06/2022   Diabetes mellitus ([place]) 10/03/2021   Leg pain, bilateral 07/18/2020   Primary osteoarthritis of both knees 07/18/2020   Hyperlipidemia 10/08/2019   Chronic hypertension 04/14/2017    PCP: Jorge Ny, PA-C  REFERRING PROVIDER: Persons, Bevely Palmer, PA  REFERRING DIAG: Pain in left hip, Chronic pain of right knee, Chronic pain of left knee  THERAPY DIAG:  No diagnosis found.  Rationale for Evaluation and Treatment: Rehabilitation  ONSET DATE: ***   SUBJECTIVE:  SUBJECTIVE STATEMENT: ***  PERTINENT HISTORY: *** PAIN:  Are you having pain? {OPRCPAIN:27236}  PRECAUTIONS: {Therapy precautions:24002}  WEIGHT BEARING RESTRICTIONS: {Yes ***/No:24003}  FALLS:  Has patient fallen in last 6 months?  {fallsyesno:27318}  LIVING ENVIRONMENT: Lives with: {OPRC lives with:25569::"lives with their family"} Lives in: {Lives in:25570} Stairs: {opstairs:27293} Has following equipment at home: {Assistive devices:23999}  OCCUPATION: ***  PLOF: {PLOF:24004}  PATIENT GOALS: ***   OBJECTIVE:  PATIENT SURVEYS:  {rehab surveys:24030}  COGNITION: Overall cognitive status: Within functional limits for tasks assessed     SENSATION: WFL  EDEMA:  {edema:24020}  MUSCLE LENGTH: ***  POSTURE:   ***  PALPATION: ***  LOWER EXTREMITY ROM:  {AROM/PROM:27142} ROM Right eval Left eval  Hip flexion    Hip extension    Hip abduction    Hip adduction    Hip internal rotation    Hip external rotation    Knee flexion    Knee extension    Ankle dorsiflexion    Ankle plantarflexion    Ankle inversion    Ankle eversion     (Blank rows = not tested)  LOWER EXTREMITY MMT:  MMT Right eval Left eval  Hip flexion    Hip extension    Hip abduction    Hip adduction    Hip internal rotation    Hip external rotation    Knee flexion    Knee extension    Ankle dorsiflexion    Ankle plantarflexion    Ankle inversion    Ankle eversion     (Blank rows = not tested)  LOWER EXTREMITY SPECIAL TESTS:  {LEspecialtests:26242}  FUNCTIONAL TESTS:  {Functional tests:24029}  GAIT: Distance walked: *** Assistive device utilized: {Assistive devices:23999} Level of assistance: {Levels of assistance:24026} Comments: ***   TODAY'S TREATMENT:    [facility]  Adult PT Treatment:                                                DATE: 06/27/2022 Therapeutic Exercise: ***  PATIENT EDUCATION:  Education details: Exam findings, POC, HEP Person educated: Patient Education method: Explanation, Demonstration, Tactile cues, Verbal cues, and Handouts Education comprehension: verbalized understanding, returned demonstration, verbal cues required, tactile cues required, and needs further education  HOME  EXERCISE PROGRAM: ***   ASSESSMENT: CLINICAL IMPRESSION: Patient is a 47 y.o. female who was seen today for physical therapy evaluation and treatment for ***.   OBJECTIVE IMPAIRMENTS: {opptimpairments:25111}.   ACTIVITY LIMITATIONS: {activitylimitations:27494}  PARTICIPATION LIMITATIONS: {participationrestrictions:25113}  PERSONAL FACTORS: {Personal factors:25162} are also affecting patient's functional outcome.   REHAB POTENTIAL: {rehabpotential:25112}  CLINICAL DECISION MAKING: {clinical decision making:25114}  EVALUATION COMPLEXITY: {Evaluation complexity:25115}   GOALS: Goals reviewed with patient? Yes  SHORT TERM GOALS: Target date: ***  Patient will be I with initial HEP in order to progress with therapy. Baseline: HEP provided at eval Goal status: INITIAL  2.  *** Baseline:  Goal status: INITIAL  3.  *** Baseline:  Goal status: INITIAL  LONG TERM GOALS: Target date: ***  Patient will be I with final HEP to maintain progress from PT. Baseline: HEP provided at eval Goal status: INITIAL  2.  *** Baseline:  Goal status: INITIAL  3.  *** Baseline:  Goal status: INITIAL  4.  *** Baseline:  Goal status: INITIAL   PLAN: PT FREQUENCY: 1x/week  PT DURATION: 8 weeks  PLANNED INTERVENTIONS: {rehab planned interventions:25118::"Therapeutic exercises","Therapeutic activity","Neuromuscular re-education","Balance training","Gait training","Patient/Family education","Self Care","Joint mobilization"}  PLAN FOR NEXT SESSION: Review HEP and progress PRN, ***   Hilda Blades, PT, DPT, LAT, ATC 06/27/22  9:24 AM Phone: [phone] Fax: [phone]   [employer] Authorization   Choose one: Rehabilitative  Standardized Assessment or Functional Outcome Tool: {AdultStandardizedAssessments:24814::"See Pain Assessment"}  Score or Percent Disability: ***  Body Parts Treated (Select each separately):  {BodyParts:24813}. Overall deficits/functional  limitations for body part selected: {MILD/MODERATE:22562} {BodyParts:24813}. Overall deficits/functional limitations for body part selected: {MILD/MODERATE:22562} {BodyParts:24813}. Overall deficits/functional limitations for body part selected: {MILD/MODERATE:22562}   If treatment provided at initial evaluation, no treatment charged due to lack of authorization.

## 2022-07-12 ENCOUNTER — Ambulatory Visit: Payer: Medicaid Other

## 2022-07-29 ENCOUNTER — Ambulatory Visit: Payer: Commercial Managed Care - HMO

## 2022-08-02 ENCOUNTER — Other Ambulatory Visit: Payer: Self-pay | Admitting: Physician Assistant

## 2022-08-27 ENCOUNTER — Encounter: Payer: Medicaid Other | Admitting: Internal Medicine

## 2022-08-28 NOTE — Progress Notes (Signed)
Office Visit Note  Patient: Kelsey Hunter             Date of Birth: [birth year]           MRN: [id number]             PCP: Quita Skye, PA-C Referring: Persons, [place], [place] Visit Date: 08/30/2022   Subjective:  New Patient (Initial Visit) (Patient states her knees and hip hurt her the most. Patient states she gets numbness in her right hand.)   History of Present Illness: Kelsey Hunter is a 47 y.o. female here for evaluation of positive ANA and elevated sedimentation rate associated with chronic joint pain some widespread some more severe in hip and knees.  Pain has been ongoing for years without a specific acute onset has progressed somewhat over time but also varying in intensity.  Experiences pain extending from the low back to the hips to the knees on both sides sometimes radiating.  Stiffness is increased for a few minutes in the morning and then gets worse also with prolonged standing and walking.  She has had previous steroid injection for this with temporary symptom improvement.  Looks like last knee injection was in July 2023 with [facility].  Gets partial benefit with NSAIDs taking alternating meloxicam and ibuprofen on different days.  She was also referred to physical therapy but no showed many appointments.  She worked on weight loss and has decreased her total weight significantly but did not see a dramatic improvement in joint pain from this.  Sometimes notices knee swelling but often has pain without and no other visible swelling noted elsewhere.  Denies any new skin rashes, photosensitivity, oral nasal ulcers, lymphadenopathy, Raynaud's symptoms, or history of blood clots.  Labs reviewed 05/2022 ANA 1:80 intercellular bridge RF neg ESR 17 CRP 17.4  Activities of Daily Living:  Patient reports morning stiffness for 5-10 minutes.   Patient Reports nocturnal pain.  Difficulty dressing/grooming: Reports Difficulty climbing stairs:  Reports Difficulty getting out of chair: Reports Difficulty using hands for taps, buttons, cutlery, and/or writing: Reports  Review of Systems  Constitutional:  Positive for fatigue.  HENT:  Positive for mouth dryness. Negative for mouth sores.   Eyes:  Negative for dryness.  Respiratory:  Positive for shortness of breath.   Cardiovascular:  Positive for palpitations. Negative for chest pain.  Gastrointestinal:  Negative for blood in stool, constipation and diarrhea.  Endocrine: Negative for increased urination.  Genitourinary:  Negative for involuntary urination.  Musculoskeletal:  Positive for joint pain, joint pain, joint swelling, myalgias, muscle weakness, morning stiffness, muscle tenderness and myalgias. Negative for gait problem.  Skin:  Positive for rash. Negative for color change, hair loss and sensitivity to sunlight.  Allergic/Immunologic: Negative for susceptible to infections.  Neurological:  Positive for headaches. Negative for dizziness.  Hematological:  Negative for swollen glands.  Psychiatric/Behavioral:  Negative for depressed mood and sleep disturbance. The patient is not nervous/anxious.     PMFS History:  Patient Active Problem List   Diagnosis Date Noted   Positive ANA (antinuclear antibody) 08/30/2022   GERD (gastroesophageal reflux disease) 02/06/2022   Diabetes mellitus 10/03/2021   Leg pain, bilateral 07/18/2020   Primary osteoarthritis of both knees 07/18/2020   Hyperlipidemia 10/08/2019   Chronic hypertension 04/14/2017    Past Medical History:  Diagnosis Date   Arthritis    Asthma    inhaler when needed   Diabetes mellitus without complication    Gestational diabetes  mellitus (GDM) in third trimester 08/07/2017   Current Diabetic Medications:  None   Aspirin 81 mg daily after 12 weeks; discontinue after 36 weeks (? A2/B GDM)  Required Referrals for A1GDM or A2GDM:  Diabetes Education and Testing Supplies  Nutrition Cousult  For A2/B GDM or  higher classes of DM  Diabetes Education and Testing Supplies  Nutrition Counsult  Fetal ECHO after 20 weeks   Eye exam for retina evaluation   Base   Hypertension     Family History  Problem Relation Age of Onset   Epilepsy Mother    Kidney disease Father    Heart disease Father    Breast cancer Maternal Aunt    Breast cancer Maternal Aunt    Past Surgical History:  Procedure Laterality Date   CHOLECYSTECTOMY  03/2000   TUBAL LIGATION Bilateral 10/09/2017   Procedure: POST PARTUM TUBAL LIGATION;  Surgeon: [place] Bing, MD;  Location: [facility];  Service: Gynecology;  Laterality: Bilateral;   Social History   Social History Narrative   Not on file   Immunization History  Administered Date(s) Administered   Influenza,inj,Quad PF,6+ Mos 04/14/2017, 02/11/2019   Tdap 07/24/2017     Objective: Vital Signs: BP 117/83 (BP Location: Right Arm, Patient Position: Sitting, Cuff Size: Normal)   Pulse 83   Resp 20   Ht  (1.549 m)   Wt 244 lb (110.7 kg)   LMP 07/31/2022   BMI 46.10 kg/m    Physical Exam Constitutional:      Appearance: She is obese.  Cardiovascular:     Rate and Rhythm: Normal rate and regular rhythm.  Pulmonary:     Effort: Pulmonary effort is normal.     Breath sounds: Normal breath sounds.  Musculoskeletal:     Right lower leg: No edema.     Left lower leg: No edema.  Skin:    Findings: No rash.  Neurological:     General: No focal deficit present.     Mental Status: She is alert.     Deep Tendon Reflexes: Reflexes normal.  Psychiatric:        Mood and Affect: Mood normal.      Musculoskeletal Exam:  Shoulders full ROM no tenderness or swelling Elbows full ROM no tenderness or swelling Wrists full ROM no tenderness or swelling Fingers full ROM no tenderness or swelling Low back midline and bilateral tenderness to pressure, radiation into leg, left lateral hip tenderness and with rotation Knees full ROM right knee  crepitus, mild joint line tenderness to pressure  Investigation: No additional findings.  Imaging: No results found.  Recent Labs: Lab Results  Component Value Date   WBC 7.1 05/21/2022   HGB 13.3 05/21/2022   PLT 232 05/21/2022   NA 135 09/29/2020   K 3.7 09/29/2020   CL 104 09/29/2020   CO2 23 09/29/2020   GLUCOSE 121 (H) 09/29/2020   BUN 20 09/29/2020   CREATININE 1.23 (H) 09/29/2020   BILITOT 0.5 10/08/2017   ALKPHOS 220 (H) 10/08/2017   AST 29 10/08/2017   ALT 24 10/08/2017   PROT 6.4 (L) 10/08/2017   ALBUMIN 2.6 (L) 10/08/2017   CALCIUM 9.1 09/29/2020   GFRAA >60 10/08/2017    Speciality Comments: No specialty comments available.  Procedures:  No procedures performed Allergies: Bee pollen and Tylenol [acetaminophen]   Assessment / Plan:     Visit Diagnoses: Positive ANA (antinuclear antibody) - Plan: Anti-scleroderma antibody, RNP  Antibody, Anti-Smith antibody, Sjogrens syndrome-A extractable nuclear antibody, Sjogrens syndrome-B extractable nuclear antibody, Anti-DNA antibody, double-stranded, C3 and C4  Positive ANA and chronic joint pain but does have some known osteoarthritis of affected areas and no other systemic clinical criteria suggestive of autoimmune disease process.  No peripheral joint synovitis appreciable on exam today either.  Will check more specific antibody panel as detailed above and serum complements for screening due to abnormal labs but with lower pretest suspicion of an active autoimmune disease or that this accounts for the symptom complaints.  Primary osteoarthritis of both knees Leg pain, bilateral  Has pain in both knees consistent with osteoarthritis but also suspicious for some lumbar radiculopathy symptoms due to radiating pain from the back and sciatica type posterior leg pain with use in certain positions.  If workup is nonspecific I believe she would benefit to continue follow-up with orthopedics or sports medicine clinic may even  need to consider updated advanced imaging of the lumbar spine.  Orders: Orders Placed This Encounter  Procedures   Anti-scleroderma antibody   RNP Antibody   Anti-Smith antibody   Sjogrens syndrome-A extractable nuclear antibody   Sjogrens syndrome-B extractable nuclear antibody   Anti-DNA antibody, double-stranded   C3 and C4   No orders of the defined types were placed in this encounter.    Follow-Up Instructions: No follow-ups on file.   Fuller Plan, MD  Note - This record has been created using [employer].  Chart creation errors have been sought, but may not always  have been located. Such creation errors do not reflect on  the standard of medical care.

## 2022-08-30 ENCOUNTER — Ambulatory Visit: Payer: Medicaid Other | Attending: Internal Medicine | Admitting: Internal Medicine

## 2022-08-30 ENCOUNTER — Encounter: Payer: Self-pay | Admitting: Internal Medicine

## 2022-08-30 VITALS — BP 117/83 | HR 83 | Resp 20 | Ht 61.0 in | Wt 244.0 lb

## 2022-08-30 DIAGNOSIS — M170 Bilateral primary osteoarthritis of knee: Secondary | ICD-10-CM

## 2022-08-30 DIAGNOSIS — M79605 Pain in left leg: Secondary | ICD-10-CM | POA: Diagnosis not present

## 2022-08-30 DIAGNOSIS — M79604 Pain in right leg: Secondary | ICD-10-CM | POA: Diagnosis not present

## 2022-08-30 DIAGNOSIS — R768 Other specified abnormal immunological findings in serum: Secondary | ICD-10-CM

## 2022-08-30 NOTE — Patient Instructions (Signed)

## 2022-08-31 LAB — C3 AND C4
C3 Complement: 153 mg/dL (ref 83–193)
C4 Complement: 22 mg/dL (ref 15–57)

## 2022-08-31 LAB — SJOGRENS SYNDROME-A EXTRACTABLE NUCLEAR ANTIBODY: SSA (Ro) (ENA) Antibody, IgG: 6.3 AI — AB

## 2022-08-31 LAB — SJOGRENS SYNDROME-B EXTRACTABLE NUCLEAR ANTIBODY: SSB (La) (ENA) Antibody, IgG: <1 AI

## 2022-08-31 LAB — RNP ANTIBODY: Ribonucleic Protein(ENA) Antibody, IgG: <1 AI

## 2022-08-31 LAB — ANTI-SCLERODERMA ANTIBODY: Scleroderma (Scl-70) (ENA) Antibody, IgG: <1 AI

## 2022-08-31 LAB — ANTI-DNA ANTIBODY, DOUBLE-STRANDED: ds DNA Ab: <1 IU/mL

## 2022-08-31 LAB — ANTI-SMITH ANTIBODY: ENA SM Ab Ser-aCnc: <1 AI

## 2022-09-11 ENCOUNTER — Ambulatory Visit: Payer: Medicaid Other

## 2022-09-12 ENCOUNTER — Telehealth: Payer: Self-pay | Admitting: Internal Medicine

## 2022-09-12 DIAGNOSIS — M170 Bilateral primary osteoarthritis of knee: Secondary | ICD-10-CM

## 2022-09-12 NOTE — Telephone Encounter (Signed)
Kelsey Hunter called requesting a prescription of Meloxicam be sent to [facility] on [address] in [place]. This medication was prescribed by her previous provider, but they told her to contact the office she was referred to. She can be reached at [phone].

## 2022-09-12 NOTE — Telephone Encounter (Signed)
After reviewing the chart, the patient is not currently prescribed Meloxicam from our office. Please advise.

## 2022-09-13 MED ORDER — MELOXICAM 15 MG PO TABS
15.0000 mg | ORAL_TABLET | Freq: Every day | ORAL | 1 refills | Status: DC | PRN
Start: 2022-09-13 — End: 2022-12-06

## 2022-09-13 NOTE — Telephone Encounter (Signed)
I called patient, patient verbalized understanding. 

## 2022-09-13 NOTE — Addendum Note (Signed)
Addended by: Fuller Plan on: 09/13/2022 08:01 AM   Modules accepted: Orders

## 2022-09-13 NOTE — Telephone Encounter (Signed)
Okay with sending refill for this for now. Previously was Rx by her orthopedic surgery clinic. She has some medication interactions with this from her pletal increasing bleeding risk and losartan increasing kidney risk so we would need to monitor this somewhat. But not absolute contraindications.

## 2022-10-01 ENCOUNTER — Ambulatory Visit: Payer: Medicaid Other

## 2022-10-02 ENCOUNTER — Ambulatory Visit: Payer: Medicaid Other

## 2022-10-29 ENCOUNTER — Emergency Department (HOSPITAL_COMMUNITY)
Admission: EM | Admit: 2022-10-29 | Discharge: 2022-10-29 | Disposition: A | Discharge status: Home / Self Care | Payer: Medicaid Other | Attending: Emergency Medicine | Admitting: Emergency Medicine

## 2022-10-29 ENCOUNTER — Encounter (HOSPITAL_COMMUNITY): Payer: Self-pay

## 2022-10-29 DIAGNOSIS — Y92410 Unspecified street and highway as the place of occurrence of the external cause: Secondary | ICD-10-CM | POA: Diagnosis not present

## 2022-10-29 DIAGNOSIS — J45909 Unspecified asthma, uncomplicated: Secondary | ICD-10-CM | POA: Insufficient documentation

## 2022-10-29 DIAGNOSIS — Z79899 Other long term (current) drug therapy: Secondary | ICD-10-CM | POA: Insufficient documentation

## 2022-10-29 DIAGNOSIS — I10 Essential (primary) hypertension: Secondary | ICD-10-CM | POA: Diagnosis not present

## 2022-10-29 DIAGNOSIS — Z7951 Long term (current) use of inhaled steroids: Secondary | ICD-10-CM | POA: Insufficient documentation

## 2022-10-29 DIAGNOSIS — M545 Low back pain, unspecified: Secondary | ICD-10-CM | POA: Diagnosis present

## 2022-10-29 DIAGNOSIS — M542 Cervicalgia: Secondary | ICD-10-CM | POA: Diagnosis not present

## 2022-10-29 MED ORDER — METHOCARBAMOL 500 MG PO TABS: 500.0000 mg | ORAL_TABLET | Freq: Two times a day (BID) | ORAL | 0 refills | Status: AC

## 2022-10-29 MED ORDER — METHYLPREDNISOLONE 4 MG PO TBPK: ORAL_TABLET | ORAL | 0 refills | Status: AC
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
## 2022-11-25 ENCOUNTER — Ambulatory Visit: Payer: Medicaid Other | Admitting: Nurse Practitioner

## 2022-11-26 ENCOUNTER — Ambulatory Visit: Payer: Medicaid Other | Admitting: Nurse Practitioner

## 2022-12-06 ENCOUNTER — Other Ambulatory Visit: Payer: Self-pay | Admitting: Internal Medicine

## 2022-12-06 DIAGNOSIS — M170 Bilateral primary osteoarthritis of knee: Secondary | ICD-10-CM

## 2022-12-06 NOTE — Telephone Encounter (Signed)
Recommend 6 month f/u, okay to refill through then [employer]

## 2022-12-06 NOTE — Telephone Encounter (Signed)
Last Fill: 09/13/2022  Labs: 05/21/2022 CBC WNL 05/15/2022 CMP WNL  Next Visit: not on file  Last Visit: 08/30/2022  DX: Positive ANA and chronic joint pain   Current Dose per office note 08/30/2022: not discussed  Okay to refill Meloxicam? When do you want patient to follow up?

## 2022-12-09 NOTE — Telephone Encounter (Signed)
Attempted to contact patient to schedule appointment. Unable to leave message.

## 2022-12-23 ENCOUNTER — Ambulatory Visit: Payer: Medicaid Other | Admitting: Nurse Practitioner

## 2023-02-05 ENCOUNTER — Ambulatory Visit: Payer: Medicaid Other | Admitting: Obstetrics and Gynecology

## 2023-02-05 ENCOUNTER — Other Ambulatory Visit: Payer: Self-pay

## 2023-02-05 ENCOUNTER — Other Ambulatory Visit (HOSPITAL_COMMUNITY)
Admission: RE | Admit: 2023-02-05 | Discharge: 2023-02-05 | Disposition: A | Discharge status: Home / Self Care | Payer: Medicaid Other | Source: Ambulatory Visit | Attending: Obstetrics and Gynecology | Admitting: Obstetrics and Gynecology

## 2023-02-05 VITALS — BP 104/86 | HR 105 | Wt 241.0 lb

## 2023-02-05 DIAGNOSIS — N898 Other specified noninflammatory disorders of vagina: Secondary | ICD-10-CM

## 2023-02-05 DIAGNOSIS — N926 Irregular menstruation, unspecified: Secondary | ICD-10-CM | POA: Diagnosis not present

## 2023-02-05 MED ORDER — MEGESTROL ACETATE 40 MG PO TABS
40.0000 mg | ORAL_TABLET | Freq: Two times a day (BID) | ORAL | 5 refills | Status: AC
Start: 2023-02-05 — End: ?

## 2023-02-05 NOTE — Progress Notes (Signed)
NEW GYNECOLOGY PATIENT Patient name: Kelsey Hunter MRN [id number]  Date of birth: 06-10-1975 Chief Complaint:   Gynecologic Exam and Menstrual Problem     History:  Lekha Tupa is a 47 y.o. [id number] being seen today for changes in menstrual bleeding.  Skipped in April. Typically would hav emense for 3 days and after missing the menses was 8 days. Have not previously been painful menses. Hot flashes over the summer; uses ice over night due to feeling warm. Some vaginal  dryness. No abnormal vaginal discharge - currently having a fishy smell (after bleeding). Currently has a it of pinkness. Had fishy smell after last bleed. Currently sexually active with female partner; s/p TL. No prior use of any type of birth control. Currently using motrin or tylenol for pain. Light tobacco use. Has not previously used any contraceptive.   No breast or nipple discharge. Having weight fluctuate. Appetite has decreased.   Started earlier this year and this month. Has had a few light menses. Had 3 menses, lasting 3, 5 and 7 days. Has pain prior to onset of menses and pain with menses as well.      Gynecologic History No LMP recorded (lmp unknown). Contraception: tubal ligation Last Pap: 08/2021. NILM, hpv NEG Last Mammogram: BIRADS 1 Last Colonoscopy: N/A  Obstetric History OB History  Gravida Para Term Preterm AB Living  12 7 7   5 7   SAB IAB Ectopic Multiple Live Births  5     0 7    # Outcome Date GA Lbr Len/2nd Weight Sex Type Anes PTL Lv  12 Term 10/08/17 [redacted]w[redacted]d 02:26 / 00:15 7 lb 1.6 oz (3.221 kg) F Vag-Spont EPI  LIV     Birth Comments: WNL   11 SAB 2017          10 SAB 2016          9 SAB 2014          8 SAB 11/2011          7 SAB 05/2011          6 Term 2011 [redacted]w[redacted]d  7 lb 12 oz (3.515 kg) F Vag-Spont None N LIV  5 Term 2010 [redacted]w[redacted]d  7 lb 6 oz (3.345 kg) F Vag-Spont None N LIV  4 Term 2004 [redacted]w[redacted]d  9 lb 7 oz (4.281 kg) F Vag-Spont EPI N LIV  3 Term 2001 [redacted]w[redacted]d  6 lb 12 oz (3.062 kg) F Vag-Spont  None N LIV  2 Term 1994 [redacted]w[redacted]d  6 lb 6 oz (2.892 kg) F Vag-Spont None N LIV  1 Term 1992 106w0d  7 lb 7 oz (3.374 kg) F Vag-Spont  N LIV    Past Medical History:  Diagnosis Date   Arthritis    Asthma    inhaler when needed   Diabetes mellitus without complication (HCC)    Gestational diabetes mellitus (GDM) in third trimester 08/07/2017   Current Diabetic Medications:  None  [ ]  Aspirin 81 mg daily after 12 weeks; discontinue after 36 weeks (? A2/B GDM)  Required Referrals for A1GDM or A2GDM: [X]  Diabetes Education and Testing Supplies [ ]  Nutrition Cousult  For A2/B GDM or higher classes of DM [ ]  Diabetes Education and Testing Supplies [ ]  Nutrition Counsult [ ]  Fetal ECHO after 20 weeks  [ ]  Eye exam for retina evaluation   Base   Hypertension     Past Surgical History:  Procedure Laterality Date   CHOLECYSTECTOMY  03/2000  TUBAL LIGATION Bilateral 10/09/2017   Procedure: POST PARTUM TUBAL LIGATION;  Surgeon: [place] Bing, MD;  Location: [facility];  Service: Gynecology;  Laterality: Bilateral;    Current Outpatient Medications on File Prior to Visit  Medication Sig Dispense Refill   Accu-Chek Softclix Lancets lancets Use as instructed 100 each 1   albuterol (VENTOLIN HFA) 108 (90 Base) MCG/ACT inhaler Inhale 1-2 puffs into the lungs every 6 (six) hours as needed for wheezing or shortness of breath. 18 g 0   amLODipine (NORVASC) 5 MG tablet Take 5 mg by mouth daily.     atorvastatin (LIPITOR) 20 MG tablet Take 20 mg by mouth daily.     cetirizine (ZYRTEC ALLERGY) 10 MG tablet Take 1 tablet (10 mg total) by mouth daily. 30 tablet 0   cilostazol (PLETAL) 50 MG tablet Take 50 mg by mouth 2 (two) times daily.     cyclobenzaprine (FLEXERIL) 10 MG tablet Take 10 mg by mouth 2 (two) times daily as needed.     ezetimibe (ZETIA) 10 MG tablet Take 10 mg by mouth daily.     ibuprofen (ADVIL) 600 MG tablet Take by mouth.     losartan-hydrochlorothiazide (HYZAAR) 50-12.5 MG tablet  Take 1 tablet by mouth daily.     meloxicam (MOBIC) 15 MG tablet TAKE 1 TABLET(15 MG) BY MOUTH DAILY AS NEEDED FOR PAIN 90 tablet 0   methocarbamol (ROBAXIN) 500 MG tablet Take 1 tablet (500 mg total) by mouth 2 (two) times daily. 20 tablet 0   methylPREDNISolone (MEDROL DOSEPAK) 4 MG TBPK tablet Take per package instructions 1 each 0   Multiple Vitamin (MULTI VITAMIN PO) Take by mouth.     Omega-3 Fatty Acids (FISH OIL PO) Take by mouth.     pantoprazole (PROTONIX) 40 MG tablet Take 40 mg by mouth daily.     TRULICITY 3 MG/0.5ML SOPN SMARTSIG:3 Milligram(s) SUB-Q Once a Week     fluticasone (FLONASE) 50 MCG/ACT nasal spray Place 2 sprays into both nostrils daily. 1 g 0   No current facility-administered medications on file prior to visit.    Allergies  Allergen Reactions   Bee Pollen    Tylenol [Acetaminophen] Rash    Social History:  reports that she has been smoking cigarettes. She has been exposed to tobacco smoke. She has never used smokeless tobacco. She reports that she does not drink alcohol and does not use drugs.  Family History  Problem Relation Age of Onset   Epilepsy Mother    Kidney disease Father    Heart disease Father    Breast cancer Maternal Aunt    Breast cancer Maternal Aunt     The following portions of the patient's history were reviewed and updated as appropriate: allergies, current medications, past family history, past medical history, past social history, past surgical history and problem list.  Review of Systems Pertinent items noted in HPI and remainder of comprehensive ROS otherwise negative.  Physical Exam:  BP 104/86   Pulse (!) 105   Wt 241 lb (109.3 kg)   LMP  (LMP Unknown) Comment: Pt states she has been on a light period 2x this month  BMI 47.07 kg/m  Physical Exam Vitals and nursing note reviewed.  Constitutional:      Appearance: Normal appearance.  Cardiovascular:     Rate and Rhythm: Normal rate.  Pulmonary:     Effort:  Pulmonary effort is normal.     Breath sounds: Normal breath sounds.  Neurological:  General: No focal deficit present.     Mental Status: She is alert and oriented to person, place, and time.  Psychiatric:        Mood and Affect: Mood normal.        Behavior: Behavior normal.        Thought Content: Thought content normal.        Judgment: Judgment normal.        Assessment and Plan:   1. Vaginal odor Completed self swab, will follow up and treat accordingly.  - Cervicovaginal ancillary only  2. Irregular menses Suspect irregularity due to perimenopausal changes. Will obtain labs and [place] to complete workup. Megace provided prn for heavy bleeding in the interim.  - US PELVIC COMPLETE WITH TRANSVAGINAL; Future - HgB A1c - TSH Rfx on Abnormal to Free T4 - FSH - megestrol (MEGACE) 40 MG tablet; Take 1 tablet (40 mg total) by mouth 2 (two) times daily. Can increase to two tablets twice a day in the event of heavy bleeding  Dispense: 60 tablet; Refill: 5   Routine preventative health maintenance measures emphasized. Please refer to After Visit Summary for other counseling recommendations.   Follow-up: No follow-ups on file.      Lorriane Shire, MD Obstetrician & Gynecologist, Faculty Practice [facility] [employer], [facility]

## 2023-02-06 ENCOUNTER — Other Ambulatory Visit: Payer: Self-pay | Admitting: Obstetrics and Gynecology

## 2023-02-06 ENCOUNTER — Ambulatory Visit (HOSPITAL_COMMUNITY): Payer: Medicaid Other | Attending: Obstetrics and Gynecology

## 2023-02-06 DIAGNOSIS — N760 Acute vaginitis: Secondary | ICD-10-CM

## 2023-02-06 LAB — HEMOGLOBIN A1C
Est. average glucose Bld gHb Est-mCnc: 120 mg/dL
Hgb A1c MFr Bld: 5.8 % — ABNORMAL HIGH (ref 4.8–5.6)

## 2023-02-06 LAB — TSH RFX ON ABNORMAL TO FREE T4: TSH: 1.38 u[IU]/mL (ref 0.450–4.500)

## 2023-02-06 LAB — CERVICOVAGINAL ANCILLARY ONLY
Bacterial Vaginitis (gardnerella): POSITIVE — AB
Candida Glabrata: NEGATIVE
Candida Vaginitis: NEGATIVE
Chlamydia: NEGATIVE
Comment: NEGATIVE
Comment: NEGATIVE
Comment: NEGATIVE
Comment: NEGATIVE
Comment: NEGATIVE
Comment: NORMAL
Neisseria Gonorrhea: NEGATIVE
Trichomonas: NEGATIVE

## 2023-02-06 LAB — FOLLICLE STIMULATING HORMONE: FSH: 10.9 m[IU]/mL

## 2023-02-06 MED ORDER — METRONIDAZOLE 500 MG PO TABS
500.0000 mg | ORAL_TABLET | Freq: Two times a day (BID) | ORAL | 0 refills | Status: AC
Start: 2023-02-06 — End: 2023-02-13

## 2023-02-13 ENCOUNTER — Other Ambulatory Visit (HOSPITAL_COMMUNITY): Payer: Self-pay

## 2023-02-13 MED ORDER — TRULICITY 3 MG/0.5ML ~~LOC~~ SOAJ
3.0000 mg | SUBCUTANEOUS | 3 refills | Status: DC
  Filled 2023-02-13: qty 2, 28d supply, fill #0
  Filled 2023-04-25: qty 2, 28d supply, fill #1

## 2023-02-17 ENCOUNTER — Other Ambulatory Visit (HOSPITAL_COMMUNITY): Payer: Self-pay

## 2023-03-28 ENCOUNTER — Ambulatory Visit: Payer: Medicaid Other | Admitting: Nurse Practitioner

## 2023-04-20 NOTE — Progress Notes (Unsigned)
Office Visit Note  Patient: Kelsey Hunter             Date of Birth: 1976/03/30           MRN: [id number]             PCP: Quita Skye, PA-C Referring: Quita Skye, PA-C Visit Date: 04/21/2023   Subjective:  No chief complaint on file.   History of Present Illness: Kelsey Hunter is a 47 y.o. female here for follow up ***   Previous HPI 08/30/22  Kelsey Hunter is a 47 y.o. female here for evaluation of positive ANA and elevated sedimentation rate associated with chronic joint pain some widespread some more severe in hip and knees.  Pain has been ongoing for years without a specific acute onset has progressed somewhat over time but also varying in intensity.  Experiences pain extending from the low back to the hips to the knees on both sides sometimes radiating.  Stiffness is increased for a few minutes in the morning and then gets worse also with prolonged standing and walking.  She has had previous steroid injection for this with temporary symptom improvement.  Looks like last knee injection was in July 2023 with [facility].  Gets partial benefit with NSAIDs taking alternating meloxicam and ibuprofen on different days.  She was also referred to physical therapy but no showed many appointments.  She worked on weight loss and has decreased her total weight significantly but did not see a dramatic improvement in joint pain from this.  Sometimes notices knee swelling but often has pain without and no other visible swelling noted elsewhere.  Denies any new skin rashes, photosensitivity, oral nasal ulcers, lymphadenopathy, Raynaud's symptoms, or history of blood clots.   Labs reviewed 05/2022 ANA 1:80 intercellular bridge RF neg ESR 17 CRP 17.4   No Rheumatology ROS completed.   PMFS History:  Patient Active Problem List   Diagnosis Date Noted   Positive ANA (antinuclear antibody) 08/30/2022   GERD (gastroesophageal reflux disease) 02/06/2022   Diabetes  mellitus (HCC) 10/03/2021   Leg pain, bilateral 07/18/2020   Primary osteoarthritis of both knees 07/18/2020   Hyperlipidemia 10/08/2019   Chronic hypertension 04/14/2017    Past Medical History:  Diagnosis Date   Arthritis    Asthma    inhaler when needed   Diabetes mellitus without complication (HCC)    Gestational diabetes mellitus (GDM) in third trimester 08/07/2017   Current Diabetic Medications:  None  [ ]  Aspirin 81 mg daily after 12 weeks; discontinue after 36 weeks (? A2/B GDM)  Required Referrals for A1GDM or A2GDM: [X]  Diabetes Education and Testing Supplies [ ]  Nutrition Cousult  For A2/B GDM or higher classes of DM [ ]  Diabetes Education and Testing Supplies [ ]  Nutrition Counsult [ ]  Fetal ECHO after 20 weeks  [ ]  Eye exam for retina evaluation   Base   Hypertension     Family History  Problem Relation Age of Onset   Epilepsy Mother    Kidney disease Father    Heart disease Father    Breast cancer Maternal Aunt    Breast cancer Maternal Aunt    Past Surgical History:  Procedure Laterality Date   CHOLECYSTECTOMY  03/2000   TUBAL LIGATION Bilateral 10/09/2017   Procedure: POST PARTUM TUBAL LIGATION;  Surgeon: [place] Bing, MD;  Location: [facility];  Service: Gynecology;  Laterality: Bilateral;   Social History   Social History Narrative   Not  on file   Immunization History  Administered Date(s) Administered   Influenza,inj,Quad PF,6+ Mos 04/14/2017, 02/11/2019   Tdap 07/24/2017     Objective: Vital Signs: There were no vitals taken for this visit.   Physical Exam   Musculoskeletal Exam: ***  CDAI Exam: CDAI Score: -- Patient Global: --; Provider Global: -- Swollen: --; Tender: -- Joint Exam 04/21/2023   No joint exam has been documented for this visit   There is currently no information documented on the homunculus. Go to the Rheumatology activity and complete the homunculus joint exam.  Investigation: No additional  findings.  Imaging: No results found.  Recent Labs: Lab Results  Component Value Date   WBC 7.1 05/21/2022   HGB 13.3 05/21/2022   PLT 232 05/21/2022   NA 135 09/29/2020   K 3.7 09/29/2020   CL 104 09/29/2020   CO2 23 09/29/2020   GLUCOSE 121 (H) 09/29/2020   BUN 20 09/29/2020   CREATININE 1.23 (H) 09/29/2020   BILITOT 0.5 10/08/2017   ALKPHOS 220 (H) 10/08/2017   AST 29 10/08/2017   ALT 24 10/08/2017   PROT 6.4 (L) 10/08/2017   ALBUMIN 2.6 (L) 10/08/2017   CALCIUM 9.1 09/29/2020   GFRAA >60 10/08/2017    Speciality Comments: No specialty comments available.  Procedures:  No procedures performed Allergies: Bee pollen and Tylenol [acetaminophen]   Assessment / Plan:     Visit Diagnoses: No diagnosis found.  ***  Orders: No orders of the defined types were placed in this encounter.  No orders of the defined types were placed in this encounter.    Follow-Up Instructions: No follow-ups on file.   Fuller Plan, MD  Note - This record has been created using [employer].  Chart creation errors have been sought, but may not always  have been located. Such creation errors do not reflect on  the standard of medical care.

## 2023-04-21 ENCOUNTER — Ambulatory Visit: Payer: Medicaid Other | Admitting: Internal Medicine

## 2023-04-22 ENCOUNTER — Other Ambulatory Visit: Payer: Self-pay | Admitting: Internal Medicine

## 2023-04-22 DIAGNOSIS — Z79899 Other long term (current) drug therapy: Secondary | ICD-10-CM

## 2023-04-22 DIAGNOSIS — M170 Bilateral primary osteoarthritis of knee: Secondary | ICD-10-CM

## 2023-04-22 MED ORDER — MELOXICAM 15 MG PO TABS: ORAL_TABLET | ORAL | 0 refills | Status: AC

## 2023-04-22 NOTE — Telephone Encounter (Signed)
Patient contacted the office to request a medication refill.   1. Name of Medication: Meloxicam  2. How are you currently taking this medication (dosage and times per day)? 1 tablet daily as needed   3. What pharmacy would you like for that to be sent to? [facility] at [address]  Patient requested the refill to help with her pain until her appointment on 06/17/23 which was the first available.

## 2023-04-22 NOTE — Telephone Encounter (Signed)
Last Fill: 12/06/2022  Labs: 05/21/2022 CBC normal 05/15/2022 CMP normal  Next Visit: 06/17/2023  Last Visit: 08/30/2022  DX: not mentioned  Current Dose per office note 08/30/2022: not mentioned  Patient no showed an appointment yesterday because she thought her appointment was today. Patient states she can go over to [facility] tomorrow to get labs done. Please associate labs with an appropriate diagnoses.   Okay to refill Meloxicam?

## 2023-04-25 ENCOUNTER — Other Ambulatory Visit (HOSPITAL_COMMUNITY): Payer: Self-pay

## 2023-06-16 NOTE — Progress Notes (Deleted)
 Office Visit Note  Patient: Kelsey Hunter             Date of Birth: 26-Dec-1975           MRN: [id number]             PCP: Buck Search, PA-C Referring: Buck Search, PA-C Visit Date: 06/17/2023   Subjective:  No chief complaint on file.   History of Present Illness: Kelsey Hunter is a 48 y.o. female here for follow up ***   Previous HPI 08/30/22 [name] is a 48 y.o. female here for evaluation of positive ANA and elevated sedimentation rate associated with chronic joint pain some widespread some more severe in hip and knees.  Pain has been ongoing for years without a specific acute onset has progressed somewhat over time but also varying in intensity.  Experiences pain extending from the low back to the hips to the knees on both sides sometimes radiating.  Stiffness is increased for a few minutes in the morning and then gets worse also with prolonged standing and walking.  She has had previous steroid injection for this with temporary symptom improvement.  Looks like last knee injection was in July 2023 with [facility].  Gets partial benefit with NSAIDs taking alternating meloxicam  and ibuprofen  on different days.  She was also referred to physical therapy but no showed many appointments.  She worked on weight loss and has decreased her total weight significantly but did not see a dramatic improvement in joint pain from this.  Sometimes notices knee swelling but often has pain without and no other visible swelling noted elsewhere.  Denies any new skin rashes, photosensitivity, oral nasal ulcers, lymphadenopathy, Raynaud's symptoms, or history of blood clots.   Labs reviewed 05/2022 ANA 1:80 intercellular bridge RF neg ESR 17 CRP 17.4   No Rheumatology ROS completed.   PMFS History:  Patient Active Problem List   Diagnosis Date Noted   Positive ANA (antinuclear antibody) 08/30/2022   GERD (gastroesophageal reflux disease) 02/06/2022   Diabetes  mellitus (HCC) 10/03/2021   Leg pain, bilateral 07/18/2020   Primary osteoarthritis of both knees 07/18/2020   Hyperlipidemia 10/08/2019   Chronic hypertension 04/14/2017    Past Medical History:  Diagnosis Date   Arthritis    Asthma    inhaler when needed   Diabetes mellitus without complication (HCC)    Gestational diabetes mellitus (GDM) in third trimester 08/07/2017   Current Diabetic Medications:  None  [ ]  Aspirin 81 mg daily after 12 weeks; discontinue after 36 weeks (? A2/B GDM)  Required Referrals for A1GDM or A2GDM: [X]  Diabetes Education and Testing Supplies [ ]  Nutrition Cousult  For A2/B GDM or higher classes of DM [ ]  Diabetes Education and Testing Supplies [ ]  Nutrition Counsult [ ]  Fetal ECHO after 20 weeks  [ ]  Eye exam for retina evaluation   Base   Hypertension     Family History  Problem Relation Age of Onset   Epilepsy Mother    Kidney disease Father    Heart disease Father    Breast cancer Maternal Aunt    Breast cancer Maternal Aunt    Past Surgical History:  Procedure Laterality Date   CHOLECYSTECTOMY  03/2000   TUBAL LIGATION Bilateral 10/09/2017   Procedure: POST PARTUM TUBAL LIGATION;  Surgeon: Izell Harari, MD;  Location: [facility];  Service: Gynecology;  Laterality: Bilateral;   Social History   Social History Narrative   Not on  file   Immunization History  Administered Date(s) Administered   Influenza,inj,Quad PF,6+ Mos 04/14/2017, 02/11/2019   Tdap 07/24/2017     Objective: Vital Signs: There were no vitals taken for this visit.   Physical Exam   Musculoskeletal Exam: ***  CDAI Exam: CDAI Score: -- Patient Global: --; Provider Global: -- Swollen: --; Tender: -- Joint Exam 06/17/2023   No joint exam has been documented for this visit   There is currently no information documented on the homunculus. Go to the Rheumatology activity and complete the homunculus joint exam.  Investigation: No additional  findings.  Imaging: No results found.  Recent Labs: Lab Results  Component Value Date   WBC 7.1 05/21/2022   HGB 13.3 05/21/2022   PLT 232 05/21/2022   NA 135 09/29/2020   K 3.7 09/29/2020   CL 104 09/29/2020   CO2 23 09/29/2020   GLUCOSE 121 (H) 09/29/2020   BUN 20 09/29/2020   CREATININE 1.23 (H) 09/29/2020   BILITOT 0.5 10/08/2017   ALKPHOS 220 (H) 10/08/2017   AST 29 10/08/2017   ALT 24 10/08/2017   PROT 6.4 (L) 10/08/2017   ALBUMIN 2.6 (L) 10/08/2017   CALCIUM 9.1 09/29/2020   GFRAA >60 10/08/2017    Speciality Comments: No specialty comments available.  Procedures:  No procedures performed Allergies: Bee pollen and Tylenol  [acetaminophen ]   Assessment / Plan:     Visit Diagnoses: No diagnosis found.  ***  Orders: No orders of the defined types were placed in this encounter.  No orders of the defined types were placed in this encounter.    Follow-Up Instructions: No follow-ups on file.   Lonni LELON Ester, MD  Note - This record has been created using [employer].  Chart creation errors have been sought, but may not always  have been located. Such creation errors do not reflect on  the standard of medical care.

## 2023-06-17 ENCOUNTER — Ambulatory Visit: Payer: Medicaid Other | Admitting: Internal Medicine

## 2023-06-20 ENCOUNTER — Other Ambulatory Visit (HOSPITAL_COMMUNITY): Payer: Self-pay

## 2023-06-20 MED ORDER — TRULICITY 3 MG/0.5ML ~~LOC~~ SOAJ
3.0000 mg | SUBCUTANEOUS | 0 refills | Status: AC
Start: 2023-06-20 — End: ?
  Filled 2023-06-20: qty 6, 84d supply, fill #0

## 2023-08-11 NOTE — Progress Notes (Deleted)
 Office Visit Note  Patient: Kelsey Hunter             Date of Birth: [birth year]           MRN: [id number]             PCP: Quita Skye, PA-C Referring: Quita Skye, PA-C Visit Date: 08/12/2023   Subjective:  No chief complaint on file.   History of Present Illness: Kelsey Hunter is a 48 y.o. female here for follow up ***   Previous HPI 08/30/22  Kelsey Hunter is a 48 y.o. female here for evaluation of positive ANA and elevated sedimentation rate associated with chronic joint pain some widespread some more severe in hip and knees.  Pain has been ongoing for years without a specific acute onset has progressed somewhat over time but also varying in intensity.  Experiences pain extending from the low back to the hips to the knees on both sides sometimes radiating.  Stiffness is increased for a few minutes in the morning and then gets worse also with prolonged standing and walking.  She has had previous steroid injection for this with temporary symptom improvement.  Looks like last knee injection was in July 2023 with [facility].  Gets partial benefit with NSAIDs taking alternating meloxicam and ibuprofen on different days.  She was also referred to physical therapy but no showed many appointments.  She worked on weight loss and has decreased her total weight significantly but did not see a dramatic improvement in joint pain from this.  Sometimes notices knee swelling but often has pain without and no other visible swelling noted elsewhere.  Denies any new skin rashes, photosensitivity, oral nasal ulcers, lymphadenopathy, Raynaud's symptoms, or history of blood clots.   Labs reviewed 05/2022 ANA 1:80 intercellular bridge RF neg ESR 17 CRP 17.4   No Rheumatology ROS completed.   PMFS History:  Patient Active Problem List   Diagnosis Date Noted   Positive ANA (antinuclear antibody) 08/30/2022   GERD (gastroesophageal reflux disease) 02/06/2022   Diabetes  mellitus (HCC) 10/03/2021   Leg pain, bilateral 07/18/2020   Primary osteoarthritis of both knees 07/18/2020   Hyperlipidemia 10/08/2019   Chronic hypertension 04/14/2017    Past Medical History:  Diagnosis Date   Arthritis    Asthma    inhaler when needed   Diabetes mellitus without complication (HCC)    Gestational diabetes mellitus (GDM) in third trimester 08/07/2017   Current Diabetic Medications:  None  [ ]  Aspirin 81 mg daily after 12 weeks; discontinue after 36 weeks (? A2/B GDM)  Required Referrals for A1GDM or A2GDM: [X]  Diabetes Education and Testing Supplies [ ]  Nutrition Cousult  For A2/B GDM or higher classes of DM [ ]  Diabetes Education and Testing Supplies [ ]  Nutrition Counsult [ ]  Fetal ECHO after 20 weeks  [ ]  Eye exam for retina evaluation   Base   Hypertension     Family History  Problem Relation Age of Onset   Epilepsy Mother    Kidney disease Father    Heart disease Father    Breast cancer Maternal Aunt    Breast cancer Maternal Aunt    Past Surgical History:  Procedure Laterality Date   CHOLECYSTECTOMY  03/2000   TUBAL LIGATION Bilateral 10/09/2017   Procedure: POST PARTUM TUBAL LIGATION;  Surgeon: Saxman Bing, MD;  Location: [facility];  Service: Gynecology;  Laterality: Bilateral;   Social History   Social History Narrative   Not  on file   Immunization History  Administered Date(s) Administered   Influenza,inj,Quad PF,6+ Mos 04/14/2017, 02/11/2019   PFIZER(Purple Top)SARS-COV-2 Vaccination 06/25/2019, 07/20/2019   Tdap 07/24/2017     Objective: Vital Signs: There were no vitals taken for this visit.   Physical Exam   Musculoskeletal Exam: ***  CDAI Exam: CDAI Score: -- Patient Global: --; Provider Global: -- Swollen: --; Tender: -- Joint Exam 08/12/2023   No joint exam has been documented for this visit   There is currently no information documented on the homunculus. Go to the Rheumatology activity and complete the  homunculus joint exam.  Investigation: No additional findings.  Imaging: No results found.  Recent Labs: Lab Results  Component Value Date   WBC 7.1 05/21/2022   HGB 13.3 05/21/2022   PLT 232 05/21/2022   NA 135 09/29/2020   K 3.7 09/29/2020   CL 104 09/29/2020   CO2 23 09/29/2020   GLUCOSE 121 (H) 09/29/2020   BUN 20 09/29/2020   CREATININE 1.23 (H) 09/29/2020   BILITOT 0.5 10/08/2017   ALKPHOS 220 (H) 10/08/2017   AST 29 10/08/2017   ALT 24 10/08/2017   PROT 6.4 (L) 10/08/2017   ALBUMIN 2.6 (L) 10/08/2017   CALCIUM 9.1 09/29/2020   GFRAA >60 10/08/2017    Speciality Comments: No specialty comments available.  Procedures:  No procedures performed Allergies: Bee pollen and Tylenol [acetaminophen]   Assessment / Plan:     Visit Diagnoses: No diagnosis found.  ***  Orders: No orders of the defined types were placed in this encounter.  No orders of the defined types were placed in this encounter.    Follow-Up Instructions: No follow-ups on file.   Fuller Plan, MD  Note - This record has been created using [employer].  Chart creation errors have been sought, but may not always  have been located. Such creation errors do not reflect on  the standard of medical care.

## 2023-08-12 ENCOUNTER — Ambulatory Visit: Payer: Medicaid Other | Admitting: Internal Medicine

## 2023-10-30 ENCOUNTER — Other Ambulatory Visit: Payer: Self-pay | Admitting: Internal Medicine

## 2023-10-30 DIAGNOSIS — M170 Bilateral primary osteoarthritis of knee: Secondary | ICD-10-CM

## 2023-11-12 ENCOUNTER — Other Ambulatory Visit: Payer: Self-pay | Admitting: Internal Medicine

## 2023-11-12 DIAGNOSIS — M170 Bilateral primary osteoarthritis of knee: Secondary | ICD-10-CM

## 2023-12-08 ENCOUNTER — Ambulatory Visit

## 2024-01-13 ENCOUNTER — Ambulatory Visit

## 2024-01-28 ENCOUNTER — Ambulatory Visit: Admitting: Podiatry

## 2024-02-18 ENCOUNTER — Ambulatory Visit: Admitting: Obstetrics and Gynecology

## 2024-03-31 ENCOUNTER — Ambulatory Visit: Admitting: Obstetrics and Gynecology

## 2024-05-26 ENCOUNTER — Ambulatory Visit: Payer: Self-pay

## 2024-06-30 ENCOUNTER — Ambulatory Visit: Payer: Self-pay | Admitting: Obstetrics and Gynecology
